# Patient Record
Sex: Male | Born: 1955 | Race: White | Hispanic: No | Marital: Married | Smoking: Never smoker
Health system: Southern US, Community
[De-identification: ages and names within clinical notes are randomized; demographics above are authoritative.]

## PROBLEM LIST (undated history)

## (undated) DIAGNOSIS — I4891 Unspecified atrial fibrillation: Secondary | ICD-10-CM

## (undated) HISTORY — DX: Unspecified atrial fibrillation: I48.91

## (undated) HISTORY — PX: TENDON EXPLORATION: SHX5112

## (undated) HISTORY — PX: BICEPS TENDON REPAIR: SHX566

---

## 2006-04-30 ENCOUNTER — Encounter: Payer: Self-pay | Admitting: Infectious Diseases

## 2006-05-10 ENCOUNTER — Encounter: Payer: Self-pay | Admitting: Infectious Diseases

## 2006-05-10 ENCOUNTER — Ambulatory Visit: Payer: Self-pay | Admitting: Infectious Diseases

## 2006-05-10 DIAGNOSIS — M65849 Other synovitis and tenosynovitis, unspecified hand: Secondary | ICD-10-CM

## 2006-05-10 DIAGNOSIS — M65839 Other synovitis and tenosynovitis, unspecified forearm: Secondary | ICD-10-CM

## 2006-05-11 ENCOUNTER — Telehealth: Payer: Self-pay | Admitting: Infectious Diseases

## 2006-05-11 ENCOUNTER — Encounter: Payer: Self-pay | Admitting: Infectious Diseases

## 2006-05-11 LAB — CONVERTED CEMR LAB
ALT: 22 units/L (ref 0–53)
AST: 21 units/L (ref 0–37)
Basophils Absolute: 0 10*3/uL (ref 0.0–0.1)
CO2: 25 meq/L (ref 19–32)
CRP: 1.5 mg/dL — ABNORMAL HIGH (ref <0.6)
Chloride: 103 meq/L (ref 96–112)
Eosinophils Absolute: 0.3 10*3/uL (ref 0.0–0.7)
Lymphocytes Relative: 23 % (ref 12–46)
Lymphs Abs: 1.3 10*3/uL (ref 0.7–3.3)
Neutrophils Relative %: 61 % (ref 43–77)
Platelets: 285 10*3/uL (ref 150–400)
RDW: 12.4 % (ref 11.5–14.0)
Sed Rate: 26 mm/hr — ABNORMAL HIGH (ref 0–16)
Sodium: 142 meq/L (ref 135–145)
Total Bilirubin: 0.9 mg/dL (ref 0.3–1.2)
Total Protein: 7 g/dL (ref 6.0–8.3)
WBC: 5.7 10*3/uL (ref 4.0–10.5)

## 2006-05-12 ENCOUNTER — Encounter: Payer: Self-pay | Admitting: Infectious Diseases

## 2006-05-26 ENCOUNTER — Telehealth: Payer: Self-pay | Admitting: Infectious Diseases

## 2006-06-01 ENCOUNTER — Inpatient Hospital Stay (HOSPITAL_COMMUNITY): Admission: RE | Admit: 2006-06-01 | Discharge: 2006-06-05 | Payer: Self-pay | Admitting: Orthopedic Surgery

## 2006-06-02 ENCOUNTER — Ambulatory Visit: Payer: Self-pay | Admitting: Infectious Diseases

## 2006-06-09 ENCOUNTER — Encounter: Payer: Self-pay | Admitting: Infectious Diseases

## 2006-06-16 ENCOUNTER — Encounter: Payer: Self-pay | Admitting: Infectious Diseases

## 2006-06-23 ENCOUNTER — Encounter: Payer: Self-pay | Admitting: Infectious Diseases

## 2006-06-25 ENCOUNTER — Ambulatory Visit: Payer: Self-pay | Admitting: Infectious Diseases

## 2006-07-07 ENCOUNTER — Encounter: Payer: Self-pay | Admitting: Infectious Diseases

## 2006-07-08 ENCOUNTER — Telehealth: Payer: Self-pay | Admitting: Infectious Diseases

## 2006-07-12 ENCOUNTER — Encounter: Payer: Self-pay | Admitting: Infectious Diseases

## 2006-07-15 ENCOUNTER — Encounter: Payer: Self-pay | Admitting: Infectious Diseases

## 2006-07-20 ENCOUNTER — Telehealth: Payer: Self-pay | Admitting: Infectious Diseases

## 2006-07-25 ENCOUNTER — Encounter: Payer: Self-pay | Admitting: Infectious Diseases

## 2006-08-02 ENCOUNTER — Ambulatory Visit: Payer: Self-pay | Admitting: Infectious Diseases

## 2006-08-06 ENCOUNTER — Encounter: Payer: Self-pay | Admitting: Infectious Diseases

## 2006-08-20 ENCOUNTER — Encounter: Payer: Self-pay | Admitting: Infectious Diseases

## 2006-09-05 ENCOUNTER — Encounter: Payer: Self-pay | Admitting: Infectious Diseases

## 2006-10-13 ENCOUNTER — Encounter: Payer: Self-pay | Admitting: Infectious Diseases

## 2006-10-20 ENCOUNTER — Ambulatory Visit: Payer: Self-pay | Admitting: Infectious Diseases

## 2007-01-19 ENCOUNTER — Ambulatory Visit: Payer: Self-pay | Admitting: Infectious Diseases

## 2007-05-04 ENCOUNTER — Ambulatory Visit: Payer: Self-pay | Admitting: Infectious Diseases

## 2009-01-23 ENCOUNTER — Encounter: Payer: Self-pay | Admitting: Infectious Diseases

## 2009-01-30 ENCOUNTER — Encounter: Payer: Self-pay | Admitting: Infectious Diseases

## 2009-02-20 ENCOUNTER — Encounter: Payer: Self-pay | Admitting: Infectious Diseases

## 2009-03-22 ENCOUNTER — Encounter: Payer: Self-pay | Admitting: Infectious Diseases

## 2010-04-15 NOTE — Consult Note (Signed)
Summary: [facility]  [facility]   Imported By: Florinda Marker 03/27/2009 15:38:33  _____________________________________________________________________  External Attachment:    Type:   Image     Comment:   External Document

## 2010-04-15 NOTE — Consult Note (Signed)
Summary: [facility]  [facility]   Imported By: Florinda Marker 04/03/2009 15:20:53  _____________________________________________________________________  External Attachment:    Type:   Image     Comment:   External Document

## 2010-08-01 NOTE — Op Note (Signed)
NAMEDEIONDRE, HARROWER              ACCOUNT NO.:  [id number]   MEDICAL RECORD NO.:  [id number]          PATIENT TYPE:  INP   LOCATION:  5003                         FACILITY:  [facility]   PHYSICIAN:  Dionne Ano. Gramig III, M.D.DATE OF BIRTH:  1956-01-31   DATE OF PROCEDURE:  06/01/2006  DATE OF DISCHARGE:                               OPERATIVE REPORT   PREOPERATIVE DIAGNOSIS:  Atypical Mycobacterial infection, right palm,  hand, middle finger, and wrist forearm region.  This patient is status  post irrigation and debridement.  He presents for repeat irrigation and  debridement.   POSTOPERATIVE DIAGNOSIS:  Atypical Mycobacterial infection, right palm,  hand, middle finger, and wrist forearm region.  This patient is status  post irrigation and debridement.  He presents for repeat irrigation and  debridement.   PROCEDURE:  1. Irrigation and debridement right wrist skin, subcutaneous tissue,      tendon, muscle, this was an excisional debridement.  2. Flexor tenosynovectomy extensive in nature, right wrist and      forearm.  3. Irrigation and debridement right middle finger skin, subcutaneous      tissue, periosteal tissue and tendon tissue.  This was an      excisional debridement.  4. Tenosynovectomy flexor digitorum profundus and flexor digitorum      superficialis, right middle finger.   SURGEON:  Dionne Ano. Amanda Pea, M.D.   ASSISTANT:  Madelynn Done, M.D.   COMPLICATIONS:  None.   ANESTHESIA:  General.   INDICATIONS FOR PROCEDURE:  This patient is a 55 year old male who  presents with the above mentioned diagnosis.  I have counseled him with  regards to the risks and benefits of surgery including risks of  infection, bleeding, anesthesia, damage to normal structures, and  failure of surgery to accomplish its intended goals of relieving  symptoms and restoring function.  With this in mind, he has consented to  proceed.  All questions have been encouraged and answered  preoperatively.   OPERATIVE PROCEDURE:  The patient was seen by myself and anesthesia,  taken to the operating suite, underwent smooth induction of general  anesthesia. He was placed supine, appropriately padded, prepped and  draped in the usual sterile fashion.  The arm was marked prior to  surgery.  Consent was signed and discussion was performed.  Once in the  operative suite, I performed I&D of skin, subcutaneous tissue,  periosteal tissue, tendon and the pulley system about the middle finger.  This was an excisional debridement and was carried out meticulously  without difficulty.  Following the excisional debridement of this  region, I then performed a tenosynovectomy and tenolysis of the FDP and  FDS tendons.  I did not see a tremendous buildup of infectious disease.   Following this, I then performed I&D of skin, subcutaneous tissue,  muscle, tendon, and periosteal tissue about the wrist, forearm and hand  region.  This was an excisional debridement.  I then performed a  tenosynovectomy and tenolysis of the FDP and FDS tendon apparatus.  The  patient tolerated this well.  Following this, I then inspected the  median nerve and superficial palmar arch as I did at multiple points  throughout the operation and noted the patient was in excellent working  status in terms of these the neurovascular structures.  Following this,  we then performed an aggressive irrigation of greater than 6 liters of  fluid through the hand, wrist, forearm and finger region.   He tolerated this well.  Pediatric feeding catheter drains were placed  x2 for a continuous inflow system of lidocaine without epinephrine and  saline mixture.  I then closed the wounds very loosely over vessel loop  drains, dressed it nicely with the dressing and he was awakened and  taken to the recovery room.  He will be continued on rifampin,  ethambutol, and Biaxin for antibiotic regimen and notify me should any  problems  occur and we will plan to see him back in the operative suite  in 48 hours for repeat I&D and possible loose closure.  We will continue  a very aggressive approach to his upper extremity predicament.  He  tolerated the procedure well today.           ______________________________  Dionne Ano. Everlene Other, M.D.     Nash Mantis  D:  06/03/2006  T:  06/03/2006  Job:  [id number]

## 2010-08-01 NOTE — Discharge Summary (Signed)
NAMEBOYD, [name]              ACCOUNT NO.:  [id number]   MEDICAL RECORD NO.:  [id number]          PATIENT TYPE:  INP   LOCATION:  5003                         FACILITY:  [facility]   PHYSICIAN:  Dionne Ano. Gramig, M.D.DATE OF BIRTH:  07-20-1955   DATE OF ADMISSION:  06/01/2006  DATE OF DISCHARGE:  06/05/2006                               DISCHARGE SUMMARY   ADMISSION DIAGNOSIS:  Right middle finger atypical infection with  mycobacterial species involving the middle finger, wrist and palm.   DISCHARGE DIAGNOSES:  Right middle finger atypical infection with  mycobacterial species involving the middle finger, wrist and palm.   SURGEON:  Dominica Severin, M.D.   PROCEDURES:  1. Multiple I&Ds of right middle finger skin and subcutaneous tissue      __________  tendon with noted extensive epi-S, epi-P, tenolysis,      tenosynovectomy, revision carpal tunnel.  2. __________ .  3. Tenosynovectomy about the flexor apparatus of the wrist and      forearm.  4. __________  pulley release from around the finger.   CONSULTATIONS:  Infectious disease.   BRIEF HISTORY OF PRESENT ILLNESS:  [name] is a very pleasant 55-year-  old gentleman who is well known to the practice and has been followed  diligently for the above-mentioned diagnosis.  He had previously been  seen and evaluated and underwent initial tenosynovectomy which was  highly suspicious for atypical mycobacterial species.  Given his  presentation, infectious disease was consulted.  The patient was placed  on Biaxin and ethambutol.  He had initial improvement, but, during  followup postoperatively in the clinical setting, he was noted to have  an incomplete resolution of his infectious process, and the decision was  made to return to the operative suite for repeat I&D as needed.  Cultures were pending, again with a high suspicion of an atypical  mycobacterium.  Preoperative laboratory data was stable.   HOSPITAL COURSE:  Mr.  Andrade was admitted on June 01, 2006, for  significant mycobacterial infection about the right hand.  He underwent  a series of three trips to the operative suite for repeat I&D, extensive  tenosynovectomy and repair reconstruction of associated structures as  noted above.  Infectious disease was consulted throughout his hospital  course, and he was placed on Rifampin, Biaxin and ethambutol for  atypical coverage.  Doses were as follows:  Ethambutol 400 mg p.o.  q.i.d., Rifampin 300 mg 1 p.o. b.i.d. and Biaxin 500 mg p.o. daily.  The  patient was placed on standard postoperative orders of pain management,  elevation, wound care and therapy.  He did well throughout his  postoperative course which was relatively uneventful.  T-max was 100.1.  Otherwise, the patient remained stable.  Multiple cultures were obtained  intraoperatively as he did undergo three separate I&Ds while inpatient.  Infectious disease followed closely.  His wounds continued to improve.  As he was stable, the decision was made to discharge him home.  Final  cultures were subsequently obtained which did show mycobacterial  infection consistent with Mycobacterium terrae.  On 06/05/2006, given  his overall improvement,  the decision was made to discharge him home.  He was afebrile.  Vital signs were stable.  His wounds were improved  overall.   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   CONDITION ON DISCHARGE:  Guarded in terms of the upper extremity.   DISCHARGE ACTIVITIES:  He will keep his dressings clean, dry and intact.  He will elevate frequently.   DISCHARGE MEDICATIONS:  1. Rifampin 300 mg 1 p.o. b.i.d.  2. Ethambutol 400 mg 4 tablets daily x6 months.  3. Biaxin 500 mg 1 p.o. b.i.d.  4. Percocet 5/325 1-2 p.o. q.4-6 h. p.r.n. pain.  5. Vitamin C 500 mg p.o. b.i.d.    FOLLOWUP:  He will return for followup to see Dr. Amanda Pea on Wednesday at  8:00 a.m.  He will need significant therapeutic endeavors.  He  understands the significant state of his upper extremity as discussed  with him at multiple times.  We will certainly approach this very  guardedly and cautiously given the significant infection with which he  presents.      Karie Chimera, P.A.-C.    ______________________________  Dionne Ano. Amanda Pea, M.D.    BB/MEDQ  D:  08/05/2006  T:  08/05/2006  Job:  [id number]

## 2010-08-01 NOTE — Op Note (Signed)
[name], [name]              ACCOUNT NO.:  [id number]   MEDICAL RECORD NO.:  [id number]          PATIENT TYPE:  INP   LOCATION:  5003                         FACILITY:  [facility]   PHYSICIAN:  Dionne Ano. Gramig III, M.D.DATE OF BIRTH:  1956/01/21   DATE OF PROCEDURE:  06/01/2006  DATE OF DISCHARGE:                               OPERATIVE REPORT   PREOPERATIVE DIAGNOSIS:  Right middle finger atypical infection, with a  presented Mycobacterial species involving the middle finger, wrist, and  palm.   POSTOPERATIVE DIAGNOSIS:  Right middle finger atypical infection, with a  presented mycobacterial species involving the middle finger, wrist, and  palm.   PROCEDURES:  1. I&D (irrigation and debridement) right middle finger skin,      subcutaneous tissue, muscle, periosteum, and tendon.  2. Extensive flexor digitorum profundus and flexor digitorum      superficialis tenolysis and tenosynovectomy, with debridement of      the tendons, right middle finger.  3. Revision carpal tunnel release.  4. Median nerve neurolysis, right wrist.  5. Extensive tenosynovectomy about the entire flexor apparatus of the      wrist and forearm, with noted atypical mycobacterial type      infectious process noted.  6. A4 and A2 pulley release, right middle finger   SURGEON:  Dionne Ano. Amanda Pea, M.D.   ASSISTANT:  Karie Chimera, P.A.-C.   COMPLICATIONS:  None.   ANESTHESIA:  General.   TOURNIQUET TIME:  Less than 90 minutes.   DRAINS:  Two pediatric feeding catheter drains were placed for  continuous irrigation.   CULTURES:  Tissue and fluid cultures were sent.   INDICATIONS FOR THE PROCEDURE:  [name] is a 55 year old male who  presents with the above-mentioned diagnosis.  I had previously seen the  patient and performed initial tenosynovectomy which was highly  suspicious for atypical mycobacterial species. After his  tenosynovectomy, he immediately was started on Biaxin and ethambutol  and  was seen by infectious disease, Dr. Johny Sax.  Since that time,  the patient improved initially, only to have a somewhat worsening  clinical picture in terms of incomplete resolution of the infectious  process.  I have discussed with [place] my concerns and recommended a  repeat look at the flexor apparatus, as I feel that his process has  reaccumulated and is not making clinical progression as I would like for  it to.  I have discussed with him the risks and benefits of bleeding,  infection, anesthesia, damage to normal structures, and failure of  surgery to accomplish its intended goals of relieving symptoms and  restoring function.  I also specific fully discussed with the patient  the possible risk of need for amputation and the very great difficulty  at times of eradicating an atypical mycobacterial process.  He has had  symptoms ongoing since August 2007.  I have gone over all pre- and  postop issues with him.  I have discussed with him that in my opinion a  repeat second look is absolutely necessary at this juncture, given his  failure to progress.   OPERATION IN  DETAIL:  The patient was seen by myself and anesthesia.  He  was taken to the procedural suite.  Preoperatively, the patient was  consented.  The arm was marked, and he underwent thorough counseling.  In the operative suite, the patient was prepped and draped after general  anesthesia was performed.  Prep and drape with Betadine scrub and  solution was accomplished.  Following this, the tourniquet was  insufflated to 250 mmHg, and the patient underwent incision about the  PIP region along previously made incisions.  Dissection was carried  down.  I immediately encountered what appeared to be reaccumulation of  his infectious process.  At the time of his first I&D, the patient had  markedly abnormal findings in his tendon architecture, and this  continued to show itself.  The FDP (flexor digitorum profundus)  was  markedly abnormal and torn.  The FDS was markedly attenuated as well.   At this time, I performed a brief I&D of skin, subcutaneous tissue,  periosteal tissue, and tendon and muscle tissue.  This was done in the  finger region.  Given the reaccumulation of infectious process, I  cultured him with fluid and tissue cultures.   At this time, I extended his incision and then performed a very careful  and meticulous tenosynovectomy of the FDP and FDS tendons to the middle  finger.   Following this, it was quite clear that the profundus and superficialis  tendons were highly abnormal.  The superficialis was elongated,  attenuated, and poorly competent.  The FDP tendon was the most part  ruptured.  I debrided these tendons as necessary.  Following this, I  noted a decent stump of the FDP proximally and felt that possibly an FDP-  to-FDS tendon transfer would be the next best step for this patient  along with an FDP tenodesis if his wound conditions improved ultimately.   I then dissected more proximally, tracking the infectious process.  This  went into his carpal tunnel, and thus I crossed the superficial palmar  arch region carefully, identifying the arch, and then performed an  extended carpal tunnel release incision into the forearm while tracking  the infectious process.   I then performed an extensive and exuberant tenosynovectomy of the wrist  and forearm flexor digitorum profundus. flexor digitorum superficialis,  and flexor pollicis longus.  Copious amounts of abnormal tissue and  fluid were retrieved and removed.   Following this, I then performed a median nerve neurolysis and of course  revision carpal tunnel release.  I identified the transverse carpal  ligament and re-released this as well as the proximal leaflet and the  antebrachial fascia.  This was a release performed without difficulty  under direct vision.  I then identified the median nerve and performed  a distinct and separate neurolysis of the nerve to keep it free from  harm's way.  The tenosynovectomy was complete, without difficulty.   Following this, I then deflated the tourniquet and noted excellent  refill.  The superficial palmar arch was protected, identified, and  dissected out during the course of the operative procedure.   Thus, extensive dissection from the tip of the middle finger to the  distal forearm was accomplished, and an exuberant extensive  tenosynovectomy of the finger, palm, hand, wrist, and forearm was  performed. Tenolysis and tenosynovectomy were accomplished, with noted  rupture of the FDP and a very poorly functioning FDS.  I would consider  FDP-to-FDS tendon transfer in the future if  the patient improves his  infectious process.  I would give him a very guarded prognosis.   I should also note median nerve neurolysis, carpal tunnel release, and  A2 as well as A4 pulley release were performed.   Following this, I placed a pediatric feeding catheter in the wound and  irrigated the patient with 6 liters of saline.  I took great care once  again to meticulously remove the highly impregnated infectious process  that surrounds the tissue.  This is going to be a very difficult  infection to eradicate, and I have discussed this with him and his wife.   The pediatric catheter was hooked up to continuous drainage  postoperatively, I should note.   He was tacked closed with Prolene but left open in spots.  However, all  tendon areas were covered.  I placed vessel loop drains, the pediatric  feeding catheter in the wound, and will plan for copious drip lavage and  return to the operative suite in 48 hours.  We have gone over these  issues at length, and all questions have been encouraged and answered.   I have spent over 30-45 minutes discussing his findings and issues with  him and his family.  We are going to continue a very aggressive course  of care in hopes  to try and eradicate his infection.  However, I would  give him a very guarded prognosis at this juncture.  The family and  patient understand this.  All questions have been encouraged and  answered.           ______________________________  Dionne Ano. Everlene Other, M.D.     Nash Mantis  D:  06/01/2006  T:  06/02/2006  Job:  [id number]

## 2010-08-01 NOTE — Op Note (Signed)
[name], [name]              ACCOUNT NO.:  [id number]   MEDICAL RECORD NO.:  [id number]          PATIENT TYPE:  INP   LOCATION:  5003                         FACILITY:  [facility]   PHYSICIAN:  Dionne Ano. Gramig III, M.D.DATE OF BIRTH:  04-27-1955   DATE OF PROCEDURE:  06/05/2006  DATE OF DISCHARGE:                               OPERATIVE REPORT   PREOPERATIVE DIAGNOSIS:  Atypical Mycobacterial infection, right hand  including middle finger flexor tendons, wrist and distal forearm.  He is  status post multiple I&D's and initiation of antibiotic management and  presents for third washout this hospitalization.   POSTOPERATIVE DIAGNOSIS:  Atypical Mycobacterial infection, right hand  including middle finger flexor tendons, wrist and distal forearm.  He is  status post multiple I&D's and initiation of antibiotic management and  presents for third washout this hospitalization.   PROCEDURE:  1. I&D.  Irrigation and debridement right middle finger.  This was an      excisional debridement of skin, subcutaneous tissue, muscle and      tendon with extensive removal of nonviable pre necrotic tissue.  2. Extensive tenosynovectomy and tenolysis right middle finger FDP and      FDS tendons (flexor digitorum superficialis and flexor digitorum      profundus tendons).  3. I&D wrist and forearm skin, subcutaneous tissue, muscle and      periosteal tissue.  This was excisional debridement.  4. Extensive tenosynovectomy and tenolysis flexor tendon apparatus      right wrist and forearm.  5. Median nerve neurolysis, right wrist and hand region.  6. Flexor digitorum profundus tenodesis to the superficialis and      periosteal tissue right middle finger.  7. Flex digitorum profundus tendon transfer to the flexor digitorum      superficialis in the form of a tendon transfer secondary to the FDS      attenuation and FDP disruption.   SURGEON:  Dr. Dominica Severin   ASSISTANT:  None.   COMPLICATIONS:  None.   ANESTHESIA:  General.   TOURNIQUET TIME:  Less than an hour.   DRAINS:  Two.   INDICATIONS FOR PROCEDURE:  [name] is a 55 year old male presents  with an atypical aggressive mycobacterial infection.  I have counseled  him in regards to risks and benefits of surgery.  This is the third I&D  this hospitalization. He has made ample improvements.  He understands  the proposed treatment algorithm and operative intervention noted above.  All questions have been encouraged and answered.   OPERATIVE PROCEDURE:  The patient induced with anesthesia, arm was  marked.  He was thoroughly consented and underwent preop counseling. He  was taken to the procedural suite/surgical suite and underwent general  anesthetic under the direction of Dr. Laverle Hobby.  Following this the  patient was prepped, draped in sterile fashion Betadine scrub and paint.  I removed his pediatric feeding catheter and noted that the condition of  the hand was excellent.  I then opened up the hand under sterile field  and inflated the tourniquet to 250 mmHg.   The patient underwent  I&D of the right middle finger including skin,  subcutaneous tissue, muscle and tendon.  This was done meticulously from  the tip of the middle finger to the superficial palmar arch region.  Following this, I then turned attention towards the wrist and forearm  and through the extended incision performed an I&D of skin, subcutaneous  tissue, muscle and periosteal tissue.  I saw no infectious remnants.  I  performed the I&D meticulously.   Following this, I performed meticulous tenosynovectomy of the FDP and  FDS tendons, right middle finger, freeing adhesions and removing any  nonviable tissue utilizing tenosynovectomy.   Following this I performed a right wrist median nerve neurolysis. I very  carefully mobilized nerve and swept this out of harm's way.  This  functioning nicely preoperatively.   Following  this, I then performed a tenolysis and tenosynovectomy of the  FDP and FDS tendons.  Each one was gathered and all film, debris and  particulate matter was removed from them.  I had a very clean field  which looked excellent.  I took an intraoperative photo of this.  Following this, I then performed irrigation with 6 liters of saline in  the wrist, forearm, hand, and finger.  The patient tolerated this well.   Once the irrigant was completely infused, I then performed tenodesis at  the FDP tendon.   The FDP tendon had attenuated tear and thus distally I performed  tenodesis of the FDP to the periosteal tissue and portions of the FDS  insertion.  This placed the DIP joint in slight flexion.   Following this, I then performed the flexor digitorum profundus tendon  transfer to flexor digitorum superficialis with Pulver-Taft weave  technique.  This was inset nicely without difficulty.  This was done to  provide a tendon and a finger that will be FDS driven.  Proper tension  was set and this was inset with 4-0 FiberWire.  I did not repair the  pulley system as I felt this would create undue strain and would rather  deal with slight bowstring in the future.  It is my impression that this  patient may likely have significant adhesions which may require further  intervention in the future and thus if pulley reconstruction is needed,  it could be considered at that time once the infection is completely  eradicated.   Following the tendon transfer, I then performed deflation of the  tourniquet, placement of two TLS drains, and following this closure of  the wound with Prolene. He was placed in a sterile dressing and a dorsal  splint was placed without difficulty.  There were no complicating  features.  He had excellent refill, soft compartments and hopefully will  continue to improve.   He was taken to recovery room after extubation.  He will be monitored closely and continued on rifampin,  ethambutol and Biaxin in terms of  antibiotic regime until final cultures are obtained.  He is growing an  acid-fast bacilli and has a presumed atypical mycobacterial infection of  course.  We have discussed these issues with he and  his family.  They are well aware of the plans.  Dr. Ninetta Lights and myself  (Dr. Ninetta Lights is infectious disease doctor) will continue close  observation and aggressive approach at eradicating this very difficult  infection.  All questions have been encouraged and answered.           ______________________________  Dionne Ano. Everlene Other, M.D.     [facility]  D:  06/05/2006  T:  06/05/2006  Job:  [id number]

## 2011-06-04 ENCOUNTER — Inpatient Hospital Stay: Payer: Self-pay | Admitting: Internal Medicine

## 2011-06-04 ENCOUNTER — Encounter: Payer: Self-pay | Admitting: Cardiovascular Disease

## 2011-06-04 LAB — CK TOTAL AND CKMB (NOT AT ARMC)
CK, Total: 203 U/L (ref 35–232)
CK-MB: 3.9 ng/mL — ABNORMAL HIGH (ref 0.5–3.6)

## 2011-06-04 LAB — COMPREHENSIVE METABOLIC PANEL
Alkaline Phosphatase: 66 U/L (ref 50–136)
BUN: 22 mg/dL — ABNORMAL HIGH (ref 7–18)
Calcium, Total: 8.6 mg/dL (ref 8.5–10.1)
Chloride: 107 mmol/L (ref 98–107)
Co2: 25 mmol/L (ref 21–32)
Creatinine: 1.34 mg/dL — ABNORMAL HIGH (ref 0.60–1.30)
EGFR (African American): >60
EGFR (Non-African Amer.): 58 — ABNORMAL LOW
Osmolality: 286 (ref 275–301)
SGPT (ALT): 30 U/L

## 2011-06-04 LAB — PRO B NATRIURETIC PEPTIDE: B-Type Natriuretic Peptide: 3288 pg/mL — ABNORMAL HIGH (ref 0–125)

## 2011-06-04 LAB — CBC
HCT: 40.9 % (ref 40.0–52.0)
MCH: 30.6 pg (ref 26.0–34.0)
Platelet: 234 10*3/uL (ref 150–440)
RBC: 4.52 10*6/uL (ref 4.40–5.90)
WBC: 6.4 10*3/uL (ref 3.8–10.6)

## 2011-06-04 LAB — MAGNESIUM: Magnesium: 2 mg/dL

## 2011-06-04 LAB — TROPONIN I: Troponin-I: 0.05 ng/mL

## 2011-06-04 LAB — TSH: Thyroid Stimulating Horm: 1.94 u[IU]/mL

## 2011-06-05 DIAGNOSIS — I4891 Unspecified atrial fibrillation: Secondary | ICD-10-CM

## 2011-06-05 DIAGNOSIS — I369 Nonrheumatic tricuspid valve disorder, unspecified: Secondary | ICD-10-CM

## 2011-06-05 LAB — CK TOTAL AND CKMB (NOT AT ARMC)
CK, Total: 122 U/L (ref 35–232)
CK-MB: 2.5 ng/mL (ref 0.5–3.6)
CK-MB: 2.3 ng/mL (ref 0.5–3.6)

## 2011-06-05 LAB — TROPONIN I: Troponin-I: 0.04 ng/mL

## 2011-06-08 ENCOUNTER — Telehealth: Payer: Self-pay | Admitting: Cardiovascular Disease

## 2011-06-08 NOTE — Telephone Encounter (Signed)
Pt was seen in the ED and d/c on the 22nd. Pt states that he is still having irregular HR and is having some nausea he thinks its coming from the meds he was given. Wants to know if there is something he should do

## 2011-06-08 NOTE — Telephone Encounter (Signed)
Pt concerned ABT med's/ nausea/ and app set so far out, request sooner app than 06/26/11. Sooner app made for this thursday 3/28, Was seen in ED/[place] over weekend and met Dr Mariah Milling, new med's started//cardizem, pradaxa and an unknown med, pt at work and unsure of a med for "infection".   Please advise if you feel anything else can be done by phone. Pt was happy with sooner app.

## 2011-06-09 NOTE — Telephone Encounter (Signed)
I left a message for the patient to call. 

## 2011-06-09 NOTE — Telephone Encounter (Signed)
Needs blood pressure, heart rates He is new atrial fib Started on diltiazem 30 Q8 with digoxin 0.25, pradaxa BID

## 2011-06-10 ENCOUNTER — Encounter: Payer: Self-pay | Admitting: *Deleted

## 2011-06-10 LAB — CULTURE, BLOOD (SINGLE)

## 2011-06-11 ENCOUNTER — Encounter: Payer: Self-pay | Admitting: Cardiovascular Disease

## 2011-06-11 ENCOUNTER — Ambulatory Visit (INDEPENDENT_AMBULATORY_CARE_PROVIDER_SITE_OTHER): Payer: BC Managed Care – PPO | Admitting: Cardiovascular Disease

## 2011-06-11 VITALS — BP 110/70 | HR 92 | Ht 70.0 in | Wt 184.0 lb

## 2011-06-11 DIAGNOSIS — I519 Heart disease, unspecified: Secondary | ICD-10-CM | POA: Insufficient documentation

## 2011-06-11 DIAGNOSIS — I4891 Unspecified atrial fibrillation: Secondary | ICD-10-CM

## 2011-06-11 DIAGNOSIS — I428 Other cardiomyopathies: Secondary | ICD-10-CM

## 2011-06-11 NOTE — Patient Instructions (Signed)
You are doing well. No medication changes were made.  Please call us if you have new issues that need to be addressed before your next appt.  Your physician wants you to follow-up in: 1 months.  You will receive a reminder letter in the mail two months in advance. If you don't receive a letter, please call our office to schedule the follow-up appointment.

## 2011-06-11 NOTE — Assessment & Plan Note (Signed)
Ejection fraction is depressed at 35-45%. My suspicion is that this is from atrial fibrillation. We should repeat echocardiogram after he has normal sinus rhythm restored. He may benefit from stress test if T wave abnormality continues in anterolateral and inferior leads.

## 2011-06-11 NOTE — Assessment & Plan Note (Signed)
Continues in nature fibrillation. Rate is reasonably well controlled at rest. No changes made to his medications. We will discuss starting amiodarone with him in one month, possible cardioversion.

## 2011-06-11 NOTE — Progress Notes (Signed)
   Patient ID: [name], male    DOB: June 01, 1955, 56 y.o.   MRN: [id number]  HPI Comments: [name] is a very pleasant 56 year old gentleman with no cardiac history who presented to [facility] 06/05/2011 after being seen at urgent care for sinusitis and to have atrophic fibrillation with RVR. He was started on Cardizem, digoxin with rate control. Also started on pradaxa and follows up today in clinic.  He was discharged on Levaquin which caused significant GI upset and vomiting. After stopping the antibiotic for his sinusitis, he felt better. He reports no significant shortness of breath, no edema. Overall he feels well. He does have occasional cough.  Echocardiogram shows atrial fibrillation, ejection fraction 35-45% with moderate global hypokinesis, Mildly dilated left atrium, and normal rvsp.  EKG shows atrial fibrillation with ventricular rate 92 beats per minute, T-wave abnormality in lead V4 through V6, 2, 3, aVF   Outpatient Encounter Prescriptions as of 06/11/2011  Medication Sig Dispense Refill  . dabigatran (PRADAXA) 150 MG CAPS Take 150 mg by mouth every 12 (twelve) hours.      . digoxin (LANOXIN) 0.25 MG tablet Take 250 mcg by mouth daily.      Marland Kitchen diltiazem (CARDIZEM) 30 MG tablet Take 30 mg by mouth 3 (three) times daily.      Marland Kitchen levofloxacin (LEVAQUIN) 750 MG tablet Take 750 mg by mouth daily.       Review of Systems  Constitutional: Negative.   HENT: Negative.   Eyes: Negative.   Respiratory: Positive for cough.   Cardiovascular: Negative.   Gastrointestinal: Negative.   Musculoskeletal: Negative.   Skin: Negative.   Neurological: Negative.   Hematological: Negative.   Psychiatric/Behavioral: Negative.   All other systems reviewed and are negative.    BP 110/70  Pulse 92  Ht 5\' 10"  (1.778 m)  Wt 184 lb (83.462 kg)  BMI 26.40 kg/m2  Physical Exam  Nursing note and vitals reviewed. Constitutional: He is oriented to person, place, and time. He appears  well-developed and well-nourished.  HENT:  Head: Normocephalic.  Nose: Nose normal.  Mouth/Throat: Oropharynx is clear and moist.  Eyes: Conjunctivae are normal. Pupils are equal, round, and reactive to light.  Neck: Normal range of motion. Neck supple. No JVD present.  Cardiovascular: Normal rate, regular rhythm, S1 normal, S2 normal, normal heart sounds and intact distal pulses.  Exam reveals no gallop and no friction rub.   No murmur heard. Pulmonary/Chest: Effort normal and breath sounds normal. No respiratory distress. He has no wheezes. He has no rales. He exhibits no tenderness.  Abdominal: Soft. Bowel sounds are normal. He exhibits no distension. There is no tenderness.  Musculoskeletal: Normal range of motion. He exhibits no edema and no tenderness.  Lymphadenopathy:    He has no cervical adenopathy.  Neurological: He is alert and oriented to person, place, and time. Coordination normal.  Skin: Skin is warm and dry. No rash noted. No erythema.  Psychiatric: He has a normal mood and affect. His behavior is normal. Judgment and thought content normal.           Assessment and Plan

## 2011-06-26 ENCOUNTER — Encounter: Payer: Self-pay | Admitting: Cardiovascular Disease

## 2011-07-01 ENCOUNTER — Encounter: Payer: Self-pay | Admitting: *Deleted

## 2011-07-14 ENCOUNTER — Encounter: Payer: Self-pay | Admitting: Cardiovascular Disease

## 2011-07-15 ENCOUNTER — Ambulatory Visit: Payer: BC Managed Care – PPO | Admitting: Cardiovascular Disease

## 2011-07-29 ENCOUNTER — Encounter: Payer: Self-pay | Admitting: Cardiovascular Disease

## 2011-07-29 ENCOUNTER — Ambulatory Visit (INDEPENDENT_AMBULATORY_CARE_PROVIDER_SITE_OTHER): Payer: BC Managed Care – PPO | Admitting: Cardiovascular Disease

## 2011-07-29 VITALS — BP 105/74 | HR 90 | Ht 70.0 in | Wt 188.0 lb

## 2011-07-29 DIAGNOSIS — I428 Other cardiomyopathies: Secondary | ICD-10-CM

## 2011-07-29 DIAGNOSIS — I4891 Unspecified atrial fibrillation: Secondary | ICD-10-CM

## 2011-07-29 MED ORDER — AMIODARONE HCL 200 MG PO TABS: 200.0000 mg | ORAL_TABLET | Freq: Two times a day (BID) | ORAL | Status: DC

## 2011-07-29 NOTE — Assessment & Plan Note (Signed)
No symptoms while in atrial fibrillation with adequate rate control. Anticoagulation now for 6 weeks. We will start amiodarone 200 mg twice a day with cardioversion in 2 weeks. EKG prior to cardioversion with lab work at that time.

## 2011-07-29 NOTE — Patient Instructions (Addendum)
You are doing well. Please start amiodarone one in the AM and PM  Please send [place] an EKG in two weeks  If you are in atrial fibrillation at that time, we will schedule a cardioversion  Please call us if you have new issues that need to be addressed before your next appt.  Your physician wants you to follow-up in: 2 months.  You will receive a reminder letter in the mail two months in advance. If you don't receive a letter, please call our office to schedule the follow-up appointment.

## 2011-07-29 NOTE — Assessment & Plan Note (Signed)
Cardiomyopathy on echocardiogram. I would suggest repeating echocardiogram after restoring normal sinus rhythm to determine if ejection fraction will return to normal. If ejection fraction stays reduced, would pursue ischemia workup.

## 2011-07-29 NOTE — Progress Notes (Signed)
   Patient ID: [name], male    DOB: 1955-04-22, 56 y.o.   MRN: [id number]  HPI Comments: [name] is a very pleasant 56 year old gentleman with no cardiac history who presented to [facility] 06/05/2011 after being seen at urgent care for sinusitis found to have atrial fibrillation with RVR. He was started on Cardizem, digoxin, pradaxa. He presents for routine followup .  He was discharged on Levaquin which caused significant GI upset and vomiting. After stopping the antibiotic for his sinusitis, he felt better. He reports no significant shortness of breath, no edema.  Overall he feels well. He has been on anticoagulation for 6 weeks with no problems. He is interested in cardioversion  Echocardiogram shows atrial fibrillation, ejection fraction 35-45% with moderate global hypokinesis, Mildly dilated left atrium, and normal rvsp.  EKG shows atrial fibrillation with ventricular rate 90 beats per minute, T-wave abnormality in lead V4 through V6, 2, 3, aVF   Outpatient Encounter Prescriptions as of 07/29/2011  Medication Sig Dispense Refill  . dabigatran (PRADAXA) 150 MG CAPS Take 150 mg by mouth every 12 (twelve) hours.      . digoxin (LANOXIN) 0.25 MG tablet Take 250 mcg by mouth daily.      Marland Kitchen diltiazem (CARDIZEM) 30 MG tablet Take 30 mg by mouth 3 (three) times daily.       Review of Systems  Constitutional: Negative.   HENT: Negative.   Eyes: Negative.   Cardiovascular: Negative.   Gastrointestinal: Negative.   Musculoskeletal: Negative.   Skin: Negative.   Neurological: Negative.   Hematological: Negative.   Psychiatric/Behavioral: Negative.   All other systems reviewed and are negative.    BP 105/74  Pulse 90  Ht 5\' 10"  (1.778 m)  Wt 188 lb (85.276 kg)  BMI 26.98 kg/m2  Physical Exam  Nursing note and vitals reviewed. Constitutional: He is oriented to person, place, and time. He appears well-developed and well-nourished.  HENT:  Head: Normocephalic.  Nose: Nose  normal.  Mouth/Throat: Oropharynx is clear and moist.  Eyes: Conjunctivae are normal. Pupils are equal, round, and reactive to light.  Neck: Normal range of motion. Neck supple. No JVD present.  Cardiovascular: Normal rate, S1 normal, S2 normal, normal heart sounds and intact distal pulses.  An irregularly irregular rhythm present. Exam reveals no gallop and no friction rub.   No murmur heard. Pulmonary/Chest: Effort normal and breath sounds normal. No respiratory distress. He has no wheezes. He has no rales. He exhibits no tenderness.  Abdominal: Soft. Bowel sounds are normal. He exhibits no distension. There is no tenderness.  Musculoskeletal: Normal range of motion. He exhibits no edema and no tenderness.  Lymphadenopathy:    He has no cervical adenopathy.  Neurological: He is alert and oriented to person, place, and time. Coordination normal.  Skin: Skin is warm and dry. No rash noted. No erythema.  Psychiatric: He has a normal mood and affect. His behavior is normal. Judgment and thought content normal.           Assessment and Plan

## 2011-07-31 ENCOUNTER — Other Ambulatory Visit: Payer: Self-pay

## 2011-07-31 MED ORDER — DIGOXIN 250 MCG PO TABS: 250.0000 ug | ORAL_TABLET | Freq: Every day | ORAL | Status: DC

## 2011-07-31 MED ORDER — DILTIAZEM HCL 30 MG PO TABS: 30.0000 mg | ORAL_TABLET | Freq: Three times a day (TID) | ORAL | Status: DC

## 2011-07-31 MED ORDER — DABIGATRAN ETEXILATE MESYLATE 150 MG PO CAPS: 150.0000 mg | ORAL_CAPSULE | Freq: Two times a day (BID) | ORAL | Status: DC

## 2011-07-31 NOTE — Telephone Encounter (Signed)
Refill sent for digoxin. 

## 2011-07-31 NOTE — Telephone Encounter (Signed)
Refill sent for digoxin, pradaxa and diltiazem.

## 2011-08-13 ENCOUNTER — Ambulatory Visit (INDEPENDENT_AMBULATORY_CARE_PROVIDER_SITE_OTHER): Payer: BC Managed Care – PPO

## 2011-08-13 VITALS — BP 120/68 | HR 46 | Ht 70.0 in | Wt 189.0 lb

## 2011-08-13 DIAGNOSIS — I4891 Unspecified atrial fibrillation: Secondary | ICD-10-CM

## 2011-08-13 MED ORDER — DILTIAZEM HCL 30 MG PO TABS: 30.0000 mg | ORAL_TABLET | Freq: Two times a day (BID) | ORAL | Status: DC

## 2011-08-13 NOTE — Progress Notes (Signed)
Pt here for f/u EKG after new meds started.  Consideration for cardioversion if still in atrial fib.  EKG showed sinus bradycardia with a rate of 46 BPM with PACs  Dr. Kirke Corin gave VO to stop digoxin and decrease diltiazem to BID and f/u with Dr. Mariah Milling in 1-2 weeks.

## 2011-08-13 NOTE — Patient Instructions (Signed)
Your physician has recommended you make the following change in your medication: stop digoxin and decrease diltiazem to twice daily instead of three times daily.   Your physician wants you to follow-up in 1-2 weeks with Dr. Mariah Milling. You will receive a reminder letter in the mail two months in advance. If you don't receive a letter, please call our office to schedule the follow-up appointment.

## 2011-08-26 ENCOUNTER — Encounter: Payer: Self-pay | Admitting: Cardiovascular Disease

## 2011-08-26 ENCOUNTER — Ambulatory Visit (INDEPENDENT_AMBULATORY_CARE_PROVIDER_SITE_OTHER): Payer: BC Managed Care – PPO | Admitting: Cardiovascular Disease

## 2011-08-26 VITALS — BP 120/70 | HR 41 | Ht 70.0 in | Wt 189.0 lb

## 2011-08-26 DIAGNOSIS — I519 Heart disease, unspecified: Secondary | ICD-10-CM

## 2011-08-26 DIAGNOSIS — I4891 Unspecified atrial fibrillation: Secondary | ICD-10-CM

## 2011-08-26 MED ORDER — CARVEDILOL 6.25 MG PO TABS: 6.2500 mg | ORAL_TABLET | Freq: Two times a day (BID) | ORAL | Status: DC

## 2011-08-26 NOTE — Patient Instructions (Addendum)
You are doing well. Hold amiodarone and diltiazem Start coreg 6.25 mg twice a day Please do pradaxa for two more weeks  We will order an echocardiogram in 1 month (mid July)  Please call us if you have new issues that need to be addressed before your next appt.  Your physician wants you to follow-up in: 6 week

## 2011-08-26 NOTE — Assessment & Plan Note (Signed)
Maintaining normal sinus rhythm. We'll change diltiazem to Coreg 6.25 mg twice a day, hold amiodarone and hold pradaxa in 2 weeks' time. Repeat echocardiogram in one month.

## 2011-08-26 NOTE — Assessment & Plan Note (Signed)
Previous echocardiogram with low ejection fraction. We will repeat his echocardiogram in one month time. His ejection fraction continues to be low, we could pursue ischemic workup given abnormal EKG. Currently with no symptoms.

## 2011-08-26 NOTE — Progress Notes (Signed)
   Patient ID: [name], male    DOB: 1955/07/31, 56 y.o.   MRN: [id number]  HPI Comments: [name] is a very pleasant 56 year old gentleman with no cardiac history who presented to [facility] 06/05/2011 after being seen at urgent care for sinusitis found to have atrial fibrillation with RVR. He was started on Cardizem, digoxin, pradaxa.   He was discharged on Levaquin which caused significant GI upset and vomiting. After stopping the antibiotic for his sinusitis, he felt better. He reports no significant shortness of breath, no edema. He was started on amiodarone on his last clinic visit in mid May 2013. 2 weeks later, EKG showed sinus rhythm.  Prior Echocardiogram shows atrial fibrillation, ejection fraction 35-45% with moderate global hypokinesis, Mildly dilated left atrium, and normal rvsp.  EKG shows normal sinus rhythm with rate 41 beats per minute with ST and T wave abnormality V3 through V6, 2, 3, aVF    Outpatient Encounter Prescriptions as of 08/26/2011  Medication Sig Dispense Refill  . dabigatran (PRADAXA) 150 MG CAPS Take 150 mg by mouth 2 (two) times daily.      Marland Kitchen amiodarone (PACERONE) 200 MG tablet Take 1 tablet (200 mg total) by mouth 2 (two) times daily.  60 tablet  3  . dabigatran (PRADAXA) 150 MG CAPS Take 1 capsule (150 mg total) by mouth every 12 (twelve) hours.  60 capsule  3  . diltiazem (CARDIZEM) 30 MG tablet Take 1 tablet (30 mg total) by mouth 2 (two) times daily before lunch and supper.  90 tablet  3   Review of Systems  Constitutional: Negative.   HENT: Negative.   Eyes: Negative.   Respiratory: Negative.   Cardiovascular: Negative.   Gastrointestinal: Negative.   Musculoskeletal: Negative.   Skin: Negative.   Neurological: Negative.   Hematological: Negative.   Psychiatric/Behavioral: Negative.   All other systems reviewed and are negative.    BP 120/70  Pulse 41  Ht 5\' 10"  (1.778 m)  Wt 189 lb (85.73 kg)  BMI 27.12 kg/m2  Physical Exam    Nursing note and vitals reviewed. Constitutional: He is oriented to person, place, and time. He appears well-developed and well-nourished.  HENT:  Head: Normocephalic.  Nose: Nose normal.  Mouth/Throat: Oropharynx is clear and moist.  Eyes: Conjunctivae are normal. Pupils are equal, round, and reactive to light.  Neck: Normal range of motion. Neck supple. No JVD present.  Cardiovascular: Normal rate, S1 normal, S2 normal, normal heart sounds and intact distal pulses.  An irregularly irregular rhythm present. Exam reveals no gallop and no friction rub.   No murmur heard. Pulmonary/Chest: Effort normal and breath sounds normal. No respiratory distress. He has no wheezes. He has no rales. He exhibits no tenderness.  Abdominal: Soft. Bowel sounds are normal. He exhibits no distension. There is no tenderness.  Musculoskeletal: Normal range of motion. He exhibits no edema and no tenderness.  Lymphadenopathy:    He has no cervical adenopathy.  Neurological: He is alert and oriented to person, place, and time. Coordination normal.  Skin: Skin is warm and dry. No rash noted. No erythema.  Psychiatric: He has a normal mood and affect. His behavior is normal. Judgment and thought content normal.           Assessment and Plan

## 2011-09-29 ENCOUNTER — Other Ambulatory Visit: Payer: BC Managed Care – PPO

## 2011-10-06 ENCOUNTER — Other Ambulatory Visit (INDEPENDENT_AMBULATORY_CARE_PROVIDER_SITE_OTHER): Payer: BC Managed Care – PPO

## 2011-10-06 ENCOUNTER — Other Ambulatory Visit: Payer: Self-pay

## 2011-10-06 DIAGNOSIS — I4891 Unspecified atrial fibrillation: Secondary | ICD-10-CM

## 2011-10-13 ENCOUNTER — Ambulatory Visit (INDEPENDENT_AMBULATORY_CARE_PROVIDER_SITE_OTHER): Payer: BC Managed Care – PPO | Admitting: Cardiovascular Disease

## 2011-10-13 ENCOUNTER — Encounter: Payer: Self-pay | Admitting: Cardiovascular Disease

## 2011-10-13 VITALS — BP 118/58 | HR 46 | Ht 70.0 in | Wt 192.5 lb

## 2011-10-13 DIAGNOSIS — I4891 Unspecified atrial fibrillation: Secondary | ICD-10-CM

## 2011-10-13 DIAGNOSIS — I519 Heart disease, unspecified: Secondary | ICD-10-CM

## 2011-10-13 DIAGNOSIS — R0602 Shortness of breath: Secondary | ICD-10-CM

## 2011-10-13 MED ORDER — CARVEDILOL 6.25 MG PO TABS: 6.2500 mg | ORAL_TABLET | Freq: Two times a day (BID) | ORAL | Status: DC

## 2011-10-13 NOTE — Patient Instructions (Addendum)
You are doing well. Please continue cardvedilol once a day in the AM  Please call us if you have new issues that need to be addressed before your next appt.  Your physician wants you to follow-up in: 6 months.  You will receive a reminder letter in the mail two months in advance. If you don't receive a letter, please call our office to schedule the follow-up appointment.

## 2011-10-13 NOTE — Assessment & Plan Note (Signed)
He is maintaining normal sinus rhythm. Sinus bradycardia seen. He does have symptoms of dizziness, orthostatic type symptoms. We will decrease the Coreg to 6.25 mg daily. We have suggested he call the office if he converts back to atrial fibrillation. I suspect this initially occurred in the setting of a bad sinusitis infection.

## 2011-10-13 NOTE — Progress Notes (Signed)
   Patient ID: [name], male    DOB: [birth year], 56 y.o.   MRN: [id number]  HPI Comments: [name] is a very pleasant 56 year old gentleman with no cardiac history who presented to [facility] 06/05/2011 after being seen at urgent care for sinusitis found to have atrial fibrillation with RVR. He was started on Cardizem, digoxin, pradaxa.   He was discharged on Levaquin which caused significant GI upset and vomiting. After stopping the antibiotic for his sinusitis, he felt better. He reports no significant shortness of breath, no edema. He was started on amiodarone in mid May 2013. 2 weeks later, EKG showed sinus rhythm. Secondary to bradycardia, medications were changed and he was started on low-dose carvedilol 6.25 mg twice a day He does have anticoagulation/pradaxa at home and has stopped taking it at our suggestion. He will take this for emergencies only when he converts to atrial fibrillation.  Prior Echocardiogram shows atrial fibrillation, ejection fraction 35-45% with moderate global hypokinesis, Mildly dilated left atrium, and normal rvsp. Recent repeat echocardiogram confirmed ejection fraction 50-55%, mild to moderate TR, mild AI  EKG shows normal sinus rhythm with rate 46 beats per minute with ST and T wave abnormality V3 through V6, 2, 3, aVF    Outpatient Encounter Prescriptions as of 10/13/2011  Medication Sig Dispense Refill  . carvedilol (COREG) 6.25 MG tablet Take 1 tablet (6.25 mg total) by mouth 2 (two) times daily.  180 tablet  3   Review of Systems  Constitutional: Negative.   HENT: Negative.   Eyes: Negative.   Respiratory: Negative.   Cardiovascular: Negative.   Gastrointestinal: Negative.   Musculoskeletal: Negative.   Skin: Negative.   Neurological: Negative.        Rare dizziness when bending over and standing up  Hematological: Negative.   Psychiatric/Behavioral: Negative.   All other systems reviewed and are negative.    BP 118/58  Pulse 46  Ht 5'  10" (1.778 m)  Wt 192 lb 8 oz (87.317 kg)  BMI 27.62 kg/m2  Physical Exam  Nursing note and vitals reviewed. Constitutional: He is oriented to person, place, and time. He appears well-developed and well-nourished.  HENT:  Head: Normocephalic.  Nose: Nose normal.  Mouth/Throat: Oropharynx is clear and moist.  Eyes: Conjunctivae are normal. Pupils are equal, round, and reactive to light.  Neck: Normal range of motion. Neck supple. No JVD present.  Cardiovascular: Regular rhythm, S1 normal, S2 normal, normal heart sounds and intact distal pulses.  Bradycardia present.  Exam reveals no gallop and no friction rub.   No murmur heard. Pulmonary/Chest: Effort normal and breath sounds normal. No respiratory distress. He has no wheezes. He has no rales. He exhibits no tenderness.  Abdominal: Soft. Bowel sounds are normal. He exhibits no distension. There is no tenderness.  Musculoskeletal: Normal range of motion. He exhibits no edema and no tenderness.  Lymphadenopathy:    He has no cervical adenopathy.  Neurological: He is alert and oriented to person, place, and time. Coordination normal.  Skin: Skin is warm and dry. No rash noted. No erythema.  Psychiatric: He has a normal mood and affect. His behavior is normal. Judgment and thought content normal.           Assessment and Plan

## 2011-10-13 NOTE — Assessment & Plan Note (Signed)
Ejection fraction has improved back to normal on repeat echocardiogram

## 2012-04-13 ENCOUNTER — Ambulatory Visit: Payer: BC Managed Care – PPO | Admitting: Cardiovascular Disease

## 2012-04-22 ENCOUNTER — Encounter: Payer: Self-pay | Admitting: Cardiovascular Disease

## 2012-04-22 ENCOUNTER — Ambulatory Visit (INDEPENDENT_AMBULATORY_CARE_PROVIDER_SITE_OTHER): Payer: BC Managed Care – PPO | Admitting: Cardiovascular Disease

## 2012-04-22 VITALS — BP 120/68 | HR 59 | Ht 70.0 in | Wt 189.8 lb

## 2012-04-22 DIAGNOSIS — I4891 Unspecified atrial fibrillation: Secondary | ICD-10-CM

## 2012-04-22 DIAGNOSIS — E785 Hyperlipidemia, unspecified: Secondary | ICD-10-CM

## 2012-04-22 MED ORDER — CARVEDILOL 3.125 MG PO TABS: 3.1250 mg | ORAL_TABLET | Freq: Two times a day (BID) | ORAL | Status: DC

## 2012-04-22 NOTE — Progress Notes (Signed)
Patient ID: [name], male    DOB: Jun 26, 1955, 57 y.o.   MRN: [id number]  HPI Comments: [name] is a very pleasant 57 year old gentleman with no cardiac history who presented to [facility] 06/05/2011 after being seen at urgent care for sinusitis found to have atrial fibrillation with RVR. He was started on Cardizem, digoxin, pradaxa.   He was discharged on Levaquin which caused significant GI upset and vomiting. After stopping the antibiotic for his sinusitis, he felt better. He was started on amiodarone in mid May 2013. 2 weeks later, EKG showed sinus rhythm. Secondary to bradycardia, medications were changed and he was started on low-dose carvedilol 6.25 mg twice a day He does have anticoagulation/pradaxa at home and has stopped taking it at our suggestion. He will take this for emergencies only when he converts to atrial fibrillation.  He reports having some bradycardia on carvedilol and he try taking carvedilol 6.25 mg once daily. On this regimen, he had headaches at 3 in the morning and started taking the medication twice a day with resolution of his headaches. He would like to take less medication if possible. Otherwise he feels well with no recent palpitations or tachycardia for atrial fibrillation  Prior Echocardiogram shows atrial fibrillation, ejection fraction 35-45% with moderate global hypokinesis, Mildly dilated left atrium, and normal rvsp. Recent repeat echocardiogram confirmed ejection fraction 50-55%, mild to moderate TR, mild AI  EKG shows normal sinus rhythm with rate 59 beats per minute with ST and T wave abnormality V3 through V6, 2, 3, aVF , PVCs and bigeminal pattern. Rare APC   Outpatient Encounter Prescriptions as of 04/22/2012  Medication Sig Dispense Refill  . carvedilol (COREG) 6.25 MG tablet Take 1 tablet (6.25 mg total) by mouth 2 (two) times daily.  180 tablet  3    Review of Systems  Constitutional: Negative.   HENT: Negative.   Eyes: Negative.    Respiratory: Negative.   Cardiovascular: Positive for palpitations.  Gastrointestinal: Negative.   Musculoskeletal: Negative.   Skin: Negative.   Neurological: Negative.        Rare dizziness when bending over and standing up  Hematological: Negative.   Psychiatric/Behavioral: Negative.   All other systems reviewed and are negative.    BP 120/68  Pulse 59  Ht 5\' 10"  (1.778 m)  Wt 189 lb 12 oz (86.07 kg)  BMI 27.23 kg/m2  Physical Exam  Nursing note and vitals reviewed. Constitutional: He is oriented to person, place, and time. He appears well-developed and well-nourished.  HENT:  Head: Normocephalic.  Nose: Nose normal.  Mouth/Throat: Oropharynx is clear and moist.  Eyes: Conjunctivae normal are normal. Pupils are equal, round, and reactive to light.  Neck: Normal range of motion. Neck supple. No JVD present.  Cardiovascular: Regular rhythm, S1 normal, S2 normal, normal heart sounds and intact distal pulses.  Bradycardia present.  Exam reveals no gallop and no friction rub.   No murmur heard. Pulmonary/Chest: Effort normal and breath sounds normal. No respiratory distress. He has no wheezes. He has no rales. He exhibits no tenderness.  Abdominal: Soft. Bowel sounds are normal. He exhibits no distension. There is no tenderness.  Musculoskeletal: Normal range of motion. He exhibits no edema and no tenderness.  Lymphadenopathy:    He has no cervical adenopathy.  Neurological: He is alert and oriented to person, place, and time. Coordination normal.  Skin: Skin is warm and dry. No rash noted. No erythema.  Psychiatric: He has a normal mood and affect.  His behavior is normal. Judgment and thought content normal.           Assessment and Plan

## 2012-04-22 NOTE — Assessment & Plan Note (Signed)
No recent episodes of atrial fibrillation. Maintaining normal sinus rhythm. Recent sinus infection could explain his PVCs. At his suggestion, we will decrease the carvedilol to 3.125 mg twice a day. He has fatigue and bradycardia. He could even wean off the medication as prior atrial fibrillation episode likely from significant sinus infection and congestion last year 2013.

## 2012-04-22 NOTE — Patient Instructions (Addendum)
You are doing well. Please cut the coreg back to 3.215 mg twice a day  Please call us if you have new issues that need to be addressed before your next appt.  Your physician wants you to follow-up in:12 months.  You will receive a reminder letter in the mail two months in advance. If you don't receive a letter, please call our office to schedule the follow-up appointment.

## 2012-08-01 ENCOUNTER — Encounter: Payer: Self-pay | Admitting: Cardiovascular Disease

## 2012-08-01 ENCOUNTER — Ambulatory Visit (INDEPENDENT_AMBULATORY_CARE_PROVIDER_SITE_OTHER): Payer: BC Managed Care – PPO | Admitting: Cardiovascular Disease

## 2012-08-01 VITALS — BP 100/80 | HR 87 | Ht 70.0 in | Wt 191.0 lb

## 2012-08-01 DIAGNOSIS — I4891 Unspecified atrial fibrillation: Secondary | ICD-10-CM

## 2012-08-01 MED ORDER — DRONEDARONE HCL 400 MG PO TABS: 400.0000 mg | ORAL_TABLET | Freq: Two times a day (BID) | ORAL | Status: DC

## 2012-08-01 MED ORDER — ASPIRIN 81 MG PO TABS: 81.0000 mg | ORAL_TABLET | Freq: Every day | ORAL | Status: DC

## 2012-08-01 NOTE — Assessment & Plan Note (Signed)
The patient is back in atrial fibrillation but his ventricular rate is controlled. However, he appears to be significantly symptomatic when he is in atrial fibrillation. Given his low blood pressure and baseline resting bradycardia, I think it will be difficult to up titrate carvedilol or other rate controlling agents. Due to that, I think he needs to be an antiarrhythmic medications. His CHADS VAS score is 0 and thus his thromboembolic risk is overall low. I recommend starting Multaq 400 mg twice daily. He will come back in one week to followup with Dr. Mariah Milling and have a repeat EKG done. I asked him to take aspirin 81 mg once daily. If he remains in atrial fibrillation, short-term anticoagulation followed by cardioversion might be needed.

## 2012-08-01 NOTE — Patient Instructions (Addendum)
Start Multaq 400 mg twice daily with a meal.  Start Aspirin 81 mg once daily.  Continue Carvedilol twice daily unless your heart rate goes below 60  Follow up in 1 week with an EKG (Dr. Mariah Milling)

## 2012-08-01 NOTE — Progress Notes (Signed)
HPI  Mr. Moten is a very pleasant 57 year old gentleman with no cardiac history who presented to [facility] 06/05/2011 after being seen at urgent care for sinusitis found to have atrial fibrillation with RVR. He was started on Cardizem, digoxin, pradaxa.  He was started on amiodarone in mid May 2013. 2 weeks later, EKG showed sinus rhythm. 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 It's not unusual for him to have a resting heart rate of less than 60 beats per minute.  Prior Echocardiogram in 2013 showed  ejection fraction 35-45% with moderate global hypokinesis, Mildly dilated left atrium, and normal rvsp. Recent repeat echocardiogram after restoring sinus rhythm confirmed ejection fraction 50-55%, mild to moderate TR, mild AI.  He was seen most recently in February and was noted to be in normal sinus rhythm. He did have frequent PVCs. He noticed palpitations, dyspnea and fatigue over the last 2 days similar to his previous episodes of atrial fibrillation. He is today noted to be in atrial fibrillation and was added to my schedule today. He has no history of diabetes, hypertension, congestive heart failure or stroke.   No Known Allergies   Current Outpatient Prescriptions on File Prior to Visit  Medication Sig Dispense Refill  . carvedilol (COREG) 3.125 MG tablet Take 1 tablet (3.125 mg total) by mouth 2 (two) times daily.  180 tablet  3   No current facility-administered medications on file prior to visit.     Past Medical History  Diagnosis Date  . Atrial fibrillation      Past Surgical History  Procedure Laterality Date  . Tendon exploration    . Biceps tendon repair       Family History  Problem Relation Age of Onset  . Hypertension Father   . Dementia Father       History   Social History  . Marital Status: Married    Spouse Name: N/A    Number of Children: N/A  . Years of Education: N/A   Occupational History  . Not on file.   Social History Main Topics  . Smoking status: Never Smoker   . Smokeless tobacco: Not on file  . Alcohol Use: 0.5 oz/week    1 drink(s) per week  . Drug Use: No  . Sexually Active: Not on file   Other Topics Concern  . Not on file   Social History Narrative  . No narrative on file     PHYSICAL EXAM   BP 100/80  Pulse 87  Ht 5\' 10"  (1.778 m)  Wt 191 lb (86.637 kg)  BMI 27.41 kg/m2 Constitutional: He is oriented to person, place, and time. He appears well-developed and well-nourished. No distress.  HENT: No nasal discharge.  Head: Normocephalic and atraumatic.  Eyes: Pupils are equal and round. Right eye exhibits no discharge. Left eye exhibits no discharge.  Neck: Normal range of motion. Neck supple. No JVD present. No thyromegaly present.  Cardiovascular: Normal rate, irregular rhythm, normal heart sounds and. Exam reveals no gallop and no friction rub. No murmur heard.  Pulmonary/Chest: Effort normal and breath sounds normal. No stridor. No respiratory distress. He has no wheezes. He has no rales. He exhibits no tenderness.  Abdominal: Soft. Bowel sounds are normal. He exhibits no distension. There is no tenderness. There is  no rebound and no guarding.  Musculoskeletal: Normal range of motion. He exhibits no edema and no tenderness.  Neurological: He is alert and oriented to person, place, and time. Coordination normal.  Skin: Skin is warm and dry. No rash noted. He is not diaphoretic. No erythema. No pallor.  Psychiatric: He has a normal mood and affect. His behavior is normal. Judgment and thought content normal.       WUJ:WJXBJY fibrillation  Nonspecific ST changes.  ABNORMAL    ASSESSMENT AND PLAN

## 2012-08-09 ENCOUNTER — Ambulatory Visit (INDEPENDENT_AMBULATORY_CARE_PROVIDER_SITE_OTHER): Payer: BC Managed Care – PPO

## 2012-08-09 VITALS — BP 116/74 | HR 85 | Ht 70.0 in | Wt 190.5 lb

## 2012-08-09 DIAGNOSIS — I4891 Unspecified atrial fibrillation: Secondary | ICD-10-CM

## 2012-08-09 NOTE — Patient Instructions (Addendum)
We will call you with Dr. Windell Hummingbird decision re: anticoagulation and cardioversion.

## 2012-08-09 NOTE — Progress Notes (Signed)
Pt here for EKG at Dr. Jari Sportsman request Will review with Dr. Kirke Corin and call pt with instructions.  Pt states he is feeling better since starting Multaq  Discussed with Dr. Kirke Corin who says pt may need cardioversion, but needs to discus with Dr. Mariah Milling what type of anticoagulation to give pt I reassured pt I would give info to Dr. Mariah Milling and we would discuss cardioversion/anticoagulation and call him back at [phone] or [phone] Understanding verb

## 2012-08-15 ENCOUNTER — Telehealth: Payer: Self-pay

## 2012-08-15 ENCOUNTER — Other Ambulatory Visit: Payer: Self-pay

## 2012-08-15 MED ORDER — DABIGATRAN ETEXILATE MESYLATE 150 MG PO CAPS: 150.0000 mg | ORAL_CAPSULE | Freq: Two times a day (BID) | ORAL | Status: DC

## 2012-08-15 NOTE — Telephone Encounter (Signed)
Message copied by Marcelle Overlie on Mon Aug 15, 2012  8:25 AM ------      Message from: Antonieta Iba      Created: Fri Aug 12, 2012  6:10 PM       Would restart pradaxa twice a day      Cardioversion in one month if still in atrial fibrillation      I can see him in clinic he would like in preparation in several weeks' time      thx      Tim            ----- Message -----         From: Marcelle Overlie, RN         Sent: 08/09/2012   4:39 PM           To: Antonieta Iba, MD            Please see note from today 5/27 and advise      thanks       ------

## 2012-08-15 NOTE — Telephone Encounter (Signed)
Pt informed Understanding verb Samples of Pradaxa left at FD appt made with Dr. Mariah Milling for 09/13/12 at pt's request

## 2012-08-15 NOTE — Telephone Encounter (Signed)
See below

## 2012-08-16 ENCOUNTER — Telehealth: Payer: Self-pay | Admitting: *Deleted

## 2012-08-16 NOTE — Telephone Encounter (Signed)
Would like RN to call him back regarding the pradaxa and if he should continue taking the other medications as well.

## 2012-08-17 NOTE — Telephone Encounter (Signed)
Pt wanted to clarify if he should continue taking carvedilol and multaq since starting on pradaxa I advised continue other meds as prescribed Understanding verb

## 2012-09-13 ENCOUNTER — Ambulatory Visit (INDEPENDENT_AMBULATORY_CARE_PROVIDER_SITE_OTHER): Payer: BC Managed Care – PPO | Admitting: Cardiovascular Disease

## 2012-09-13 ENCOUNTER — Encounter: Payer: Self-pay | Admitting: Cardiovascular Disease

## 2012-09-13 VITALS — BP 120/80 | HR 51 | Ht 70.0 in | Wt 193.5 lb

## 2012-09-13 DIAGNOSIS — I4891 Unspecified atrial fibrillation: Secondary | ICD-10-CM

## 2012-09-13 NOTE — Assessment & Plan Note (Signed)
He is converted back to normal sinus rhythm. We have suggested he stay on carvedilol and multaq if he would like to stop anticoagulation, we have suggested he purchase a pulse meter and closely monitor his heart rhythm.  In general he has mild symptoms only when in atrial fibrillation.

## 2012-09-13 NOTE — Progress Notes (Signed)
Patient ID: [name], male    DOB: 1955/07/30, 57 y.o.   MRN: [id number]  HPI Comments: [name] is a very pleasant 57 year old gentleman with no cardiac history, history of bradycardia,  who presented to [facility] 06/05/2011 after being seen at urgent care for sinusitis found to have atrial fibrillation with RVR. He was started on Cardizem, digoxin, pradaxa.   He was started on amiodarone in mid May 2013. 2 weeks later, EKG showed sinus rhythm. Secondary to bradycardia, medications were changed and he was started on low-dose carvedilol 6.25 mg twice a day which was subsequently decreased to 3.125 mg twice daily   Prior Echocardiogram in 2013 showed  ejection fraction 35-45% with moderate global hypokinesis, Mildly dilated left atrium, and normal rvsp. repeat echocardiogram after restoring sinus rhythm confirmed ejection fraction 50-55%, mild to moderate TR, mild AI.    in February 2014  was noted to be in normal sinus rhythm. He did have frequent PVCs.  He has had a day to further episodes of atrial fibrillation, last seen by Dr. Kirke Corin several weeks ago and started on Multaq.   He feels well today in followup EKG shows normal sinus rhythm with rate 51 beats minute, nonspecific ST and T wave abnormality     Outpatient Encounter Prescriptions as of 09/13/2012  Medication Sig Dispense Refill  . aspirin 81 MG tablet Take 1 tablet (81 mg total) by mouth daily.  30 tablet    . carvedilol (COREG) 3.125 MG tablet Take 1 tablet (3.125 mg total) by mouth 2 (two) times daily.  180 tablet  3  . dabigatran (PRADAXA) 150 MG CAPS Take 1 capsule (150 mg total) by mouth every 12 (twelve) hours.  60 capsule  0  . dronedarone (MULTAQ) 400 MG tablet Take 1 tablet (400 mg total) by mouth 2 (two) times daily with a meal.  60 tablet  6   No facility-administered encounter medications on file as of 09/13/2012.     Review of Systems  Constitutional: Negative.   HENT: Negative.   Eyes: Negative.    Respiratory: Negative.   Cardiovascular: Negative.   Gastrointestinal: Negative.   Musculoskeletal: Negative.   Skin: Negative.   Neurological: Negative.   Psychiatric/Behavioral: Negative.   All other systems reviewed and are negative.     BP 120/80  Pulse 51  Ht 5\' 10"  (1.778 m)  Wt 193 lb 8 oz (87.771 kg)  BMI 27.76 kg/m2  Physical Exam  Nursing note and vitals reviewed. Constitutional: He is oriented to person, place, and time. He appears well-developed and well-nourished.  HENT:  Head: Normocephalic.  Nose: Nose normal.  Mouth/Throat: Oropharynx is clear and moist.  Eyes: Conjunctivae are normal. Pupils are equal, round, and reactive to light.  Neck: Normal range of motion. Neck supple. No JVD present.  Cardiovascular: Normal rate, regular rhythm, S1 normal, S2 normal, normal heart sounds and intact distal pulses.  Exam reveals no gallop and no friction rub.   No murmur heard. Pulmonary/Chest: Effort normal and breath sounds normal. No respiratory distress. He has no wheezes. He has no rales. He exhibits no tenderness.  Abdominal: Soft. Bowel sounds are normal. He exhibits no distension. There is no tenderness.  Musculoskeletal: Normal range of motion. He exhibits no edema and no tenderness.  Lymphadenopathy:    He has no cervical adenopathy.  Neurological: He is alert and oriented to person, place, and time. Coordination normal.  Skin: Skin is warm and dry. No rash noted. No erythema.  Psychiatric: He has a normal mood and affect. His behavior is normal. Judgment and thought content normal.      Assessment and Plan

## 2012-09-13 NOTE — Patient Instructions (Addendum)
You are doing well. Hold the pradaxa Consider buying a pulse Ox meter Continue the coreg and multaq  Restart pradaxa if you are in atrial fibrillation , call the office for EKG is it does not convert back to normal rhythym  Please call us if you have new issues that need to be addressed before your next appt.  Your physician wants you to follow-up in: 6 months.  You will receive a reminder letter in the mail two months in advance. If you don't receive a letter, please call our office to schedule the follow-up appointment.

## 2013-03-15 ENCOUNTER — Encounter: Payer: Self-pay | Admitting: Cardiovascular Disease

## 2013-03-15 ENCOUNTER — Ambulatory Visit (INDEPENDENT_AMBULATORY_CARE_PROVIDER_SITE_OTHER): Payer: BC Managed Care – PPO | Admitting: Cardiovascular Disease

## 2013-03-15 VITALS — BP 130/70 | HR 107 | Ht 70.0 in | Wt 189.5 lb

## 2013-03-15 DIAGNOSIS — I519 Heart disease, unspecified: Secondary | ICD-10-CM

## 2013-03-15 DIAGNOSIS — I4891 Unspecified atrial fibrillation: Secondary | ICD-10-CM

## 2013-03-15 MED ORDER — APIXABAN 5 MG PO TABS: 5.0000 mg | ORAL_TABLET | Freq: Two times a day (BID) | ORAL | Status: DC

## 2013-03-15 MED ORDER — CARVEDILOL 6.25 MG PO TABS: 6.2500 mg | ORAL_TABLET | Freq: Two times a day (BID) | ORAL | Status: DC

## 2013-03-15 NOTE — Progress Notes (Signed)
Patient ID: [name], male    DOB: May 18, 1955, 57 y.o.   MRN: [id number]  HPI Comments: [name] is a very pleasant 57 year old gentleman with  history of paroxysmal atrial fibrillation  who presented to [facility] 06/05/2011 after being seen at urgent care for sinusitis found to have atrial fibrillation with RVR. He was started on Cardizem, digoxin, pradaxa.   started on amiodarone in mid May 2013. 2 weeks later, EKG showed sinus rhythm. Secondary to bradycardia, medications were changed and he was started on low-dose carvedilol 6.25 mg twice a day which was subsequently decreased to 3.125 mg twice daily Prior Echocardiogram in 2013 showed  ejection fraction 35-45% with moderate global hypokinesis, Mildly dilated left atrium, and normal rvsp. repeat echocardiogram after restoring sinus rhythm confirmed ejection fraction 50-55%, mild to moderate TR, mild AI.    in February 2014  was noted to be in normal sinus rhythm.  Last clinic visit seen by Dr. Kirke Corin  and started on Multaq for recurrent atrial fibrillation.  Converted to normal sinus rhythm  In followup today, he reports having one month of shortness of breath, concern he was in atrial fibrillation. He does have anticoagulation at home but did not start this. He has continued on Coreg 3.125 mg twice a day. He has not been monitoring his heart rate at home.   EKG today shows atrial fibrillation with ventricular rate 107 beats per minute, ST and T wave abnormality through the anterior precordial leads, lateral leads, inferior leads     Outpatient Encounter Prescriptions as of 03/15/2013  Medication Sig  . aspirin 81 MG tablet Take 81 mg by mouth 2 (two) times daily.  . carvedilol (COREG) 3.125 MG tablet Take 1 tablet (3.125 mg total) by mouth 2 (two) times daily.  Marland Kitchen dronedarone (MULTAQ) 400 MG tablet Take 1 tablet (400 mg total) by mouth 2 (two) times daily with a meal.    Review of Systems  Constitutional: Negative.   HENT:  Negative.   Eyes: Negative.   Respiratory: Negative.   Cardiovascular: Negative.   Gastrointestinal: Negative.   Endocrine: Negative.   Musculoskeletal: Negative.   Skin: Negative.   Allergic/Immunologic: Negative.   Neurological: Negative.   Hematological: Negative.   Psychiatric/Behavioral: Negative.   All other systems reviewed and are negative.     BP 130/70  Pulse 107  Ht 5\' 10"  (1.778 m)  Wt 189 lb 8 oz (85.957 kg)  BMI 27.19 kg/m2  Physical Exam  Nursing note and vitals reviewed. Constitutional: He is oriented to person, place, and time. He appears well-developed and well-nourished.  HENT:  Head: Normocephalic.  Nose: Nose normal.  Mouth/Throat: Oropharynx is clear and moist.  Eyes: Conjunctivae are normal. Pupils are equal, round, and reactive to light.  Neck: Normal range of motion. Neck supple. No JVD present.  Cardiovascular: Normal rate, S1 normal, S2 normal, normal heart sounds and intact distal pulses.  An irregularly irregular rhythm present. Exam reveals no gallop and no friction rub.   No murmur heard. Pulmonary/Chest: Effort normal and breath sounds normal. No respiratory distress. He has no wheezes. He has no rales. He exhibits no tenderness.  Abdominal: Soft. Bowel sounds are normal. He exhibits no distension. There is no tenderness.  Musculoskeletal: Normal range of motion. He exhibits no edema and no tenderness.  Lymphadenopathy:    He has no cervical adenopathy.  Neurological: He is alert and oriented to person, place, and time. Coordination normal.  Skin: Skin is warm and dry.  No rash noted. No erythema.  Psychiatric: He has a normal mood and affect. His behavior is normal. Judgment and thought content normal.      Assessment and Plan        will

## 2013-03-15 NOTE — Assessment & Plan Note (Signed)
Converted back to atrial fibrillation approximately one month ago. No precipitating cause. We will start him on eliquis 5 mg twice a day, increase the Coreg up to 6.25 mg twice a day. If he has not converted in one month, could consider retrying amiodarone.   we will continue his multaq twice a day with food .

## 2013-03-15 NOTE — Assessment & Plan Note (Signed)
He does report some shortness of breath with exertion. This is likely from new atrial fibrillation

## 2013-03-15 NOTE — Patient Instructions (Addendum)
Please increase the coreg/cardvedilol up to 6.25 mg twice a day If the heart rate continues to run fast after one week, Increase the dose to 12.5 mg twice a day   Please start eliquis twice a day (blood thinner)  Go to the app store: search pulse meter Download "instant heart rate", "cardiograph"  Please call us if you have new issues that need to be addressed before your next appt.  Your physician wants you to follow-up in: 1 month.

## 2013-04-03 ENCOUNTER — Other Ambulatory Visit: Payer: Self-pay | Admitting: Cardiovascular Disease

## 2013-04-25 ENCOUNTER — Ambulatory Visit (INDEPENDENT_AMBULATORY_CARE_PROVIDER_SITE_OTHER): Payer: BC Managed Care – PPO | Admitting: Cardiovascular Disease

## 2013-04-25 ENCOUNTER — Encounter: Payer: Self-pay | Admitting: Cardiovascular Disease

## 2013-04-25 VITALS — BP 98/72 | HR 79 | Ht 70.0 in | Wt 191.5 lb

## 2013-04-25 DIAGNOSIS — I4891 Unspecified atrial fibrillation: Secondary | ICD-10-CM

## 2013-04-25 DIAGNOSIS — I519 Heart disease, unspecified: Secondary | ICD-10-CM

## 2013-04-25 DIAGNOSIS — R0602 Shortness of breath: Secondary | ICD-10-CM

## 2013-04-25 MED ORDER — AMIODARONE HCL 200 MG PO TABS
200.0000 mg | ORAL_TABLET | Freq: Two times a day (BID) | ORAL | Status: DC
Start: 2013-04-25 — End: 2013-05-12

## 2013-04-25 NOTE — Assessment & Plan Note (Signed)
In the past has had systolic dysfunction wall in atrial fibrillation. EF was normal in normal sinus rhythm

## 2013-04-25 NOTE — Patient Instructions (Addendum)
Please start amiodarone 400 mg twice a day for 5 days Then decrease the dose down to 200 mg twice  A day Please take coreg 6.25 mg twice a day (please increase to 9.375 mg twice a day for heart rate >80) Come in for an ekg in 2 weeks  If rhythm seems to have converted before 2 weeks, call the office for an ekg   Please call us if you have new issues that need to be addressed before your next appt.  Your physician wants you to follow-up in: EKG in 2 weeks

## 2013-04-25 NOTE — Progress Notes (Signed)
Patient ID: [name], male    DOB: 1955/11/11, 58 y.o.   MRN: [id number]  HPI Comments: [name] is a very pleasant 58 year old gentleman with  history of paroxysmal atrial fibrillation  who presented to [facility] 06/05/2011 after being seen at urgent care for sinusitis found to have atrial fibrillation with RVR. He was started on Cardizem, digoxin, pradaxa.   started on amiodarone in mid May 2013. 2 weeks later, EKG showed sinus rhythm. Secondary to bradycardia, medications were changed and he was started on low-dose carvedilol 6.25 mg twice a day which was subsequently decreased to 3.125 mg twice daily Prior Echocardiogram in 2013 showed  ejection fraction 35-45% with moderate global hypokinesis, Mildly dilated left atrium, and normal rvsp. repeat echocardiogram after restoring sinus rhythm confirmed ejection fraction 50-55%, mild to moderate TR, mild AI.    in February 2014  was noted to be in normal sinus rhythm.   seen by Dr. Kirke Corin  and started on Multaq for recurrent atrial fibrillation. He Converted to normal sinus rhythm  On his last clinic visit, he was back in atrial fibrillation. He was having shortness of breath at rest and with exertion. He was taking carvedilol 3.125 mg twice a day He was started on eliquis 5 mg twice a day one month ago and presents today on carvedilol 9.375 mg twice a day. He continues to be in atrial fibrillation but heart rate has improved. He continues to have shortness of breath at rest and with exertion. He denies any leg edema  EKG today shows atrial fibrillation with ventricular rate 79 beats per minute, ST and T wave abnormality through the anterior precordial leads, lateral leads, inferior leads     Outpatient Encounter Prescriptions as of 04/25/2013  Medication Sig  . apixaban (ELIQUIS) 5 MG TABS tablet Take 1 tablet (5 mg total) by mouth 2 (two) times daily.  . carvedilol (COREG) 6.25 MG tablet Take 1 tablet (6.25 mg total) by mouth 2 (two) times  daily.  . MULTAQ 400 MG tablet TAKE 1 TABLET BY MOUTH TWICE A DAY WITH A MEAL  . [DISCONTINUED] aspirin 81 MG tablet Take 81 mg by mouth 2 (two) times daily.     Review of Systems  Constitutional: Negative.   HENT: Negative.   Eyes: Negative.   Respiratory: Positive for shortness of breath.   Cardiovascular: Negative.   Gastrointestinal: Negative.   Endocrine: Negative.   Musculoskeletal: Negative.   Skin: Negative.   Allergic/Immunologic: Negative.   Neurological: Negative.   Hematological: Negative.   Psychiatric/Behavioral: Negative.   All other systems reviewed and are negative.    BP 98/72  Pulse 79  Ht 5\' 10"  (1.778 m)  Wt 191 lb 8 oz (86.864 kg)  BMI 27.48 kg/m2 Repeat blood pressure by myself with systolic pressure 105 Physical Exam  Nursing note and vitals reviewed. Constitutional: He is oriented to person, place, and time. He appears well-developed and well-nourished.  HENT:  Head: Normocephalic.  Nose: Nose normal.  Mouth/Throat: Oropharynx is clear and moist.  Eyes: Conjunctivae are normal. Pupils are equal, round, and reactive to light.  Neck: Normal range of motion. Neck supple. No JVD present.  Cardiovascular: Normal rate, S1 normal, S2 normal, normal heart sounds and intact distal pulses.  An irregularly irregular rhythm present. Exam reveals no gallop and no friction rub.   No murmur heard. Pulmonary/Chest: Effort normal and breath sounds normal. No respiratory distress. He has no wheezes. He has no rales. He exhibits no tenderness.  Abdominal: Soft. Bowel sounds are normal. He exhibits no distension. There is no tenderness.  Musculoskeletal: Normal range of motion. He exhibits no edema and no tenderness.  Lymphadenopathy:    He has no cervical adenopathy.  Neurological: He is alert and oriented to person, place, and time. Coordination normal.  Skin: Skin is warm and dry. No rash noted. No erythema.  Psychiatric: He has a normal mood and affect. His  behavior is normal. Judgment and thought content normal.      Assessment and Plan        will

## 2013-04-25 NOTE — Assessment & Plan Note (Signed)
We will hold his multaq,  And start amiodarone 400 mg twice a day for 5 days, then down to 200 mg twice a day with EKG in 2 weeks' time. We have suggested he continue carvedilol 6.125 mg twice a day. If he does not convert to normal sinus rhythm, he may need a cardioversion

## 2013-05-03 ENCOUNTER — Telehealth: Payer: Self-pay

## 2013-05-03 MED ORDER — CARVEDILOL 3.125 MG PO TABS: 3.1250 mg | ORAL_TABLET | Freq: Two times a day (BID) | ORAL | Status: DC

## 2013-05-03 NOTE — Telephone Encounter (Signed)
Spoke w/ pt.  Advised him of Dr. Windell HummingbirdGollan's recommendations.  He is agreeable to change in meds and will call the office if heart rate remains low.

## 2013-05-03 NOTE — Telephone Encounter (Signed)
Pt called his HR is 38, and has been a couple of days. Asks if he should stop his medication. Please advise.

## 2013-05-03 NOTE — Telephone Encounter (Signed)
Spoke w/ pt.  He reports that his heart rate has been around 38-39 bpm since Saturday. He dropped his amiodarone dose to 200mg  BID on Monday, is currently taking coreg 6.25mg  BID. Reports that he feels fine, but a little tired.  He took both of these meds this am. Would like to know if he should continue the amiodarone, as he states that he is no longer in afib. Please advise.  Thank you.

## 2013-05-03 NOTE — Telephone Encounter (Signed)
Great that it converted back to NSR Would cut amiodarone back to 200 mg daily Cut coreg back to 3.125 mg twice a day Would monitor heart rate

## 2013-05-12 ENCOUNTER — Ambulatory Visit (INDEPENDENT_AMBULATORY_CARE_PROVIDER_SITE_OTHER): Payer: BC Managed Care – PPO | Admitting: *Deleted

## 2013-05-12 ENCOUNTER — Encounter: Payer: Self-pay | Admitting: Cardiovascular Disease

## 2013-05-12 ENCOUNTER — Ambulatory Visit (INDEPENDENT_AMBULATORY_CARE_PROVIDER_SITE_OTHER): Payer: BC Managed Care – PPO | Admitting: Cardiovascular Disease

## 2013-05-12 VITALS — BP 132/70 | HR 39 | Ht 70.0 in | Wt 191.5 lb

## 2013-05-12 DIAGNOSIS — I498 Other specified cardiac arrhythmias: Secondary | ICD-10-CM

## 2013-05-12 DIAGNOSIS — I4891 Unspecified atrial fibrillation: Secondary | ICD-10-CM

## 2013-05-12 NOTE — Progress Notes (Signed)
Patient ID: [name], male    DOB: 04/28/1955, 58 y.o.   MRN: [id number]  HPI Comments: [name] is a very pleasant 58 year old gentleman with  history of paroxysmal atrial fibrillation  who presented to [facility] 06/05/2011 after being seen at urgent care for sinusitis found to have atrial fibrillation with RVR. He was started on Cardizem, digoxin, pradaxa.   started on amiodarone in mid May 2013. 2 weeks later, EKG showed sinus rhythm. Secondary to bradycardia, medications were changed and he was started on low-dose carvedilol 6.25 mg twice a day which was subsequently decreased to 3.125 mg twice daily Prior Echocardiogram in 2013 showed  ejection fraction 35-45% with moderate global hypokinesis, Mildly dilated left atrium, and normal rvsp. repeat echocardiogram after restoring sinus rhythm confirmed ejection fraction 50-55%, mild to moderate TR, mild AI.    in February 2014  was noted to be in normal sinus rhythm.   seen by Dr. Kirke CorinArida  and started on Multaq for recurrent atrial fibrillation. He Converted to normal sinus rhythm  At the end of 2014, he was back in atrial fibrillation. He was having shortness of breath at rest and with exertion. He was taking carvedilol 3.125 mg twice a day He was started on eliquis 5 mg twice a day,  After one month we started high-dose amiodarone in an effort to cardiovert. This was successful on May 03 2013 he converted to normal sinus rhythm her heart rate was in the high 30s.  Informed call followup, we suggested he decrease amiodarone down to 200 mg daily, Coreg 3.125 mg twice a day. He has been 10 days since then and his heart rate today continues to be in the high 30s. 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 amiodarone (PACERONE) 200 MG tablet Take 200 mg by mouth daily.  Marland Kitchen. apixaban (ELIQUIS) 5 MG TABS tablet Take 1 tablet (5 mg total) by mouth 2 (two) times daily.  . carvedilol (COREG) 3.125 MG tablet Take 1 tablet (3.125 mg total) by mouth 2 (two) times daily.  . [DISCONTINUED] amiodarone (PACERONE) 200 MG tablet Take 1 tablet (200 mg total) by mouth 2 (two) times daily.     Review of Systems  Constitutional: Negative.   HENT: Negative.   Eyes: Negative.   Respiratory: Positive for shortness of breath.   Cardiovascular: Negative.   Gastrointestinal: Negative.   Endocrine: Negative.   Musculoskeletal: Negative.   Skin: Negative.   Allergic/Immunologic: Negative.   Neurological: Negative.   Hematological: Negative.   Psychiatric/Behavioral: Negative.   All other systems reviewed and are negative.    BP 132/70  Pulse 39  Ht 5\' 10"  (1.778 m)  Wt 191 lb 8 oz (86.864 kg)  BMI 27.48 kg/m2  Physical Exam  Nursing note and vitals reviewed. Constitutional: He is oriented to person, place, and time. He appears well-developed and well-nourished.  HENT:  Head: Normocephalic.  Nose: Nose normal.  Mouth/Throat: Oropharynx is clear and moist.  Eyes: Conjunctivae are normal. Pupils are equal, round, and reactive to light.  Neck: Normal range of motion. Neck supple. No JVD present.  Cardiovascular: S1 normal, S2 normal, normal heart sounds and intact distal pulses.  An irregularly irregular rhythm present. Bradycardia present.  Exam reveals no gallop and no friction rub.   No murmur heard. Pulmonary/Chest: Effort normal and breath sounds normal. No respiratory distress. He has no wheezes. He has no rales. He exhibits no tenderness.  Abdominal: Soft. Bowel sounds are normal. He exhibits no distension. There is no tenderness.   Musculoskeletal: Normal range of motion. He exhibits no edema and no tenderness.  Lymphadenopathy:    He has no cervical adenopathy.  Neurological: He is alert and oriented to person, place, and time. Coordination normal.  Skin: Skin is warm and dry. No rash noted. No erythema.  Psychiatric: He has a normal mood and affect. His behavior is normal. Judgment and thought content normal.      Assessment and Plan        will

## 2013-05-12 NOTE — Patient Instructions (Signed)
Your pulse is very slow today Please cut the amiodarone down to 100 mg once a day Cut the coreg down to 3.125 mg once a day  If your heart Rate continues to run slow in the next 1 to 2 weeks, Take amiodarone 100 mg every other day  Goal heart rate >50   Please call us if you have new issues that need to be addressed before your next appt.  Your physician wants you to follow-up in: 6 months.

## 2013-05-12 NOTE — Assessment & Plan Note (Addendum)
We have recommended that he decrease his amiodarone down to 100 mg daily, Coreg down to 3.125 mg daily. If he continues to have significant bradycardia after one to 2 weeks, we would take amiodarone 100 mg every other day. We have recommended that he take elquis for 2 more weeks then change to aspirin. He we'll closely monitor his rhythm for atrial fibrillation

## 2013-05-13 IMAGING — CR DG CHEST 2V
1 series · 2 of 2 positions shown · non-contrast
Comparison: none

REASON FOR EXAM: tachy
COMMENTS:   May transport without cardiac monitor

[Series 1: w chest pa · 0.14mm/px · 2 of 2 slices shown]
[im 1/2]
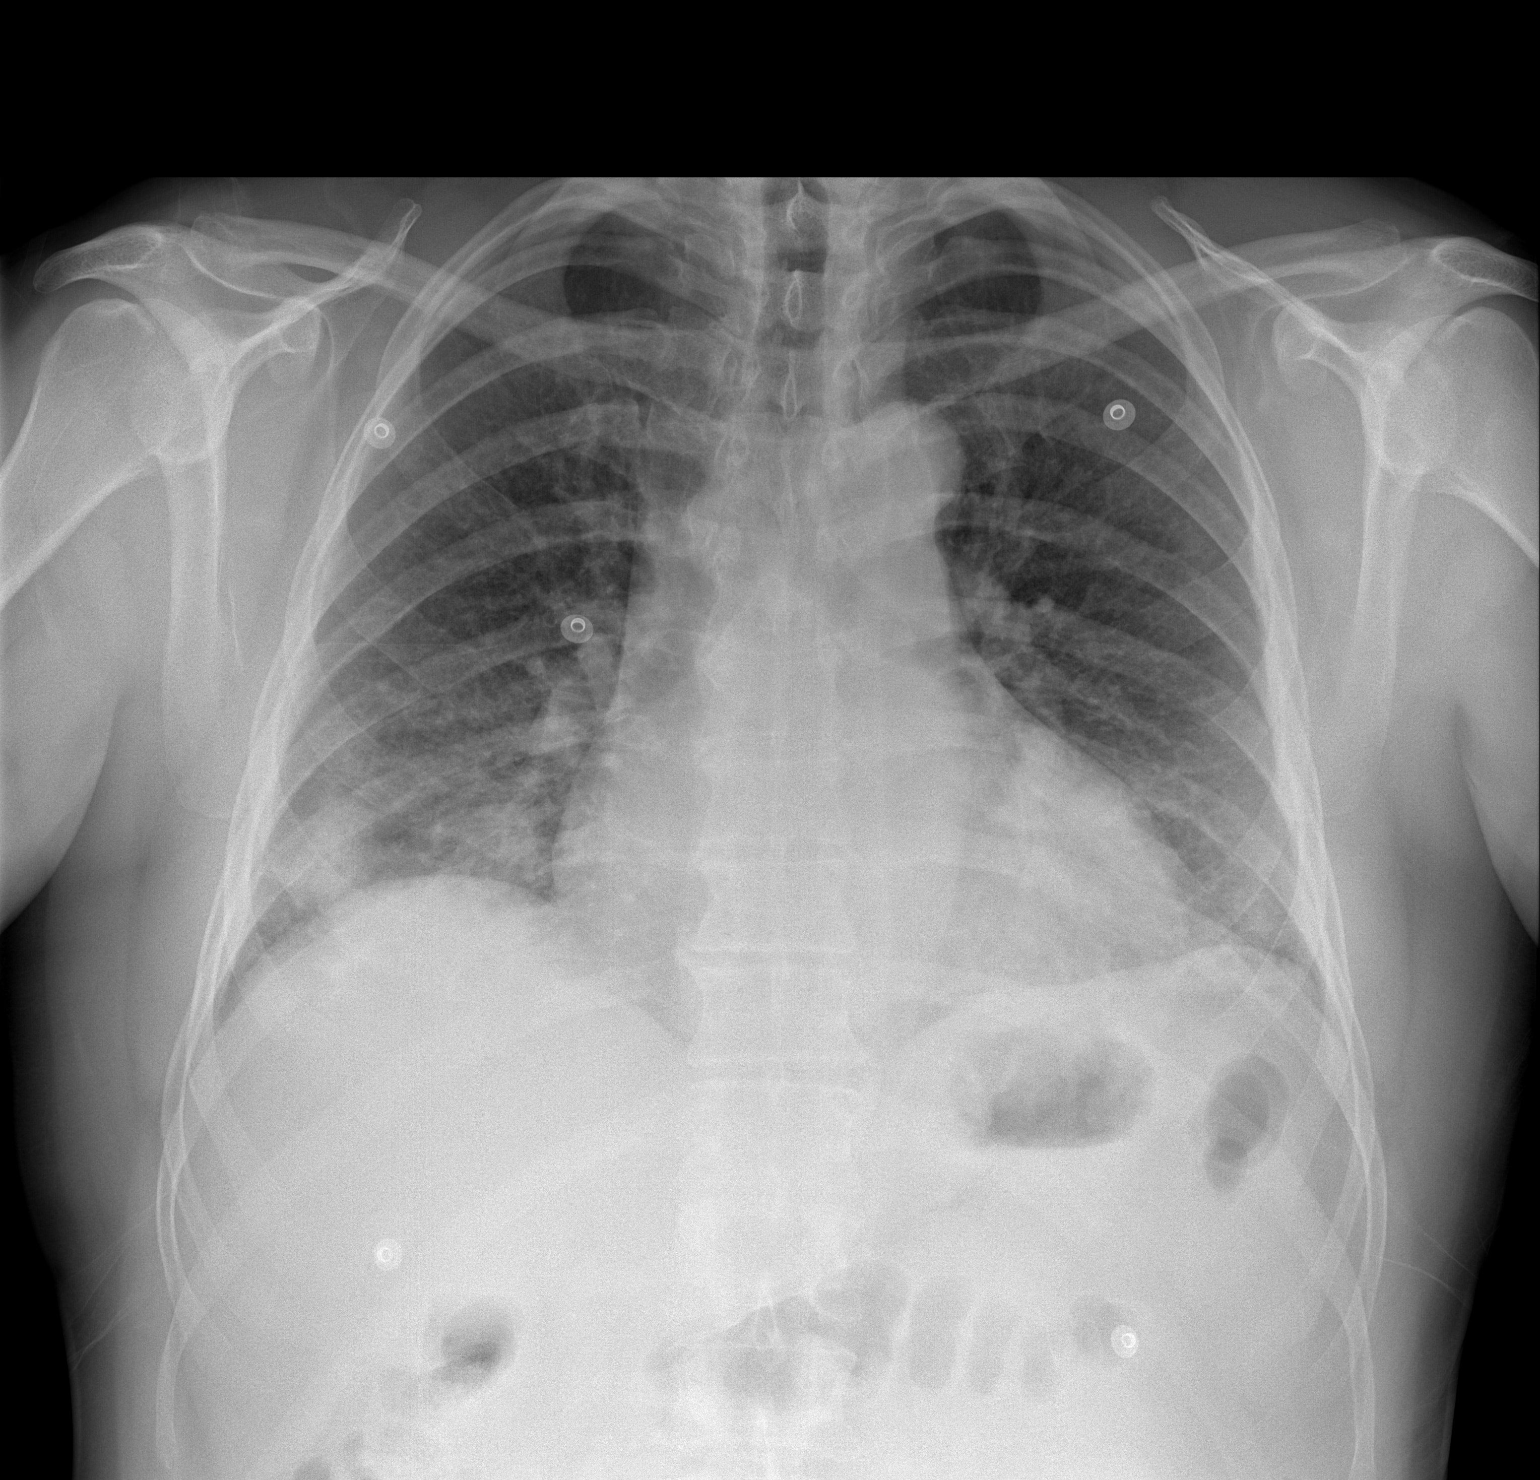
[im 2/2]
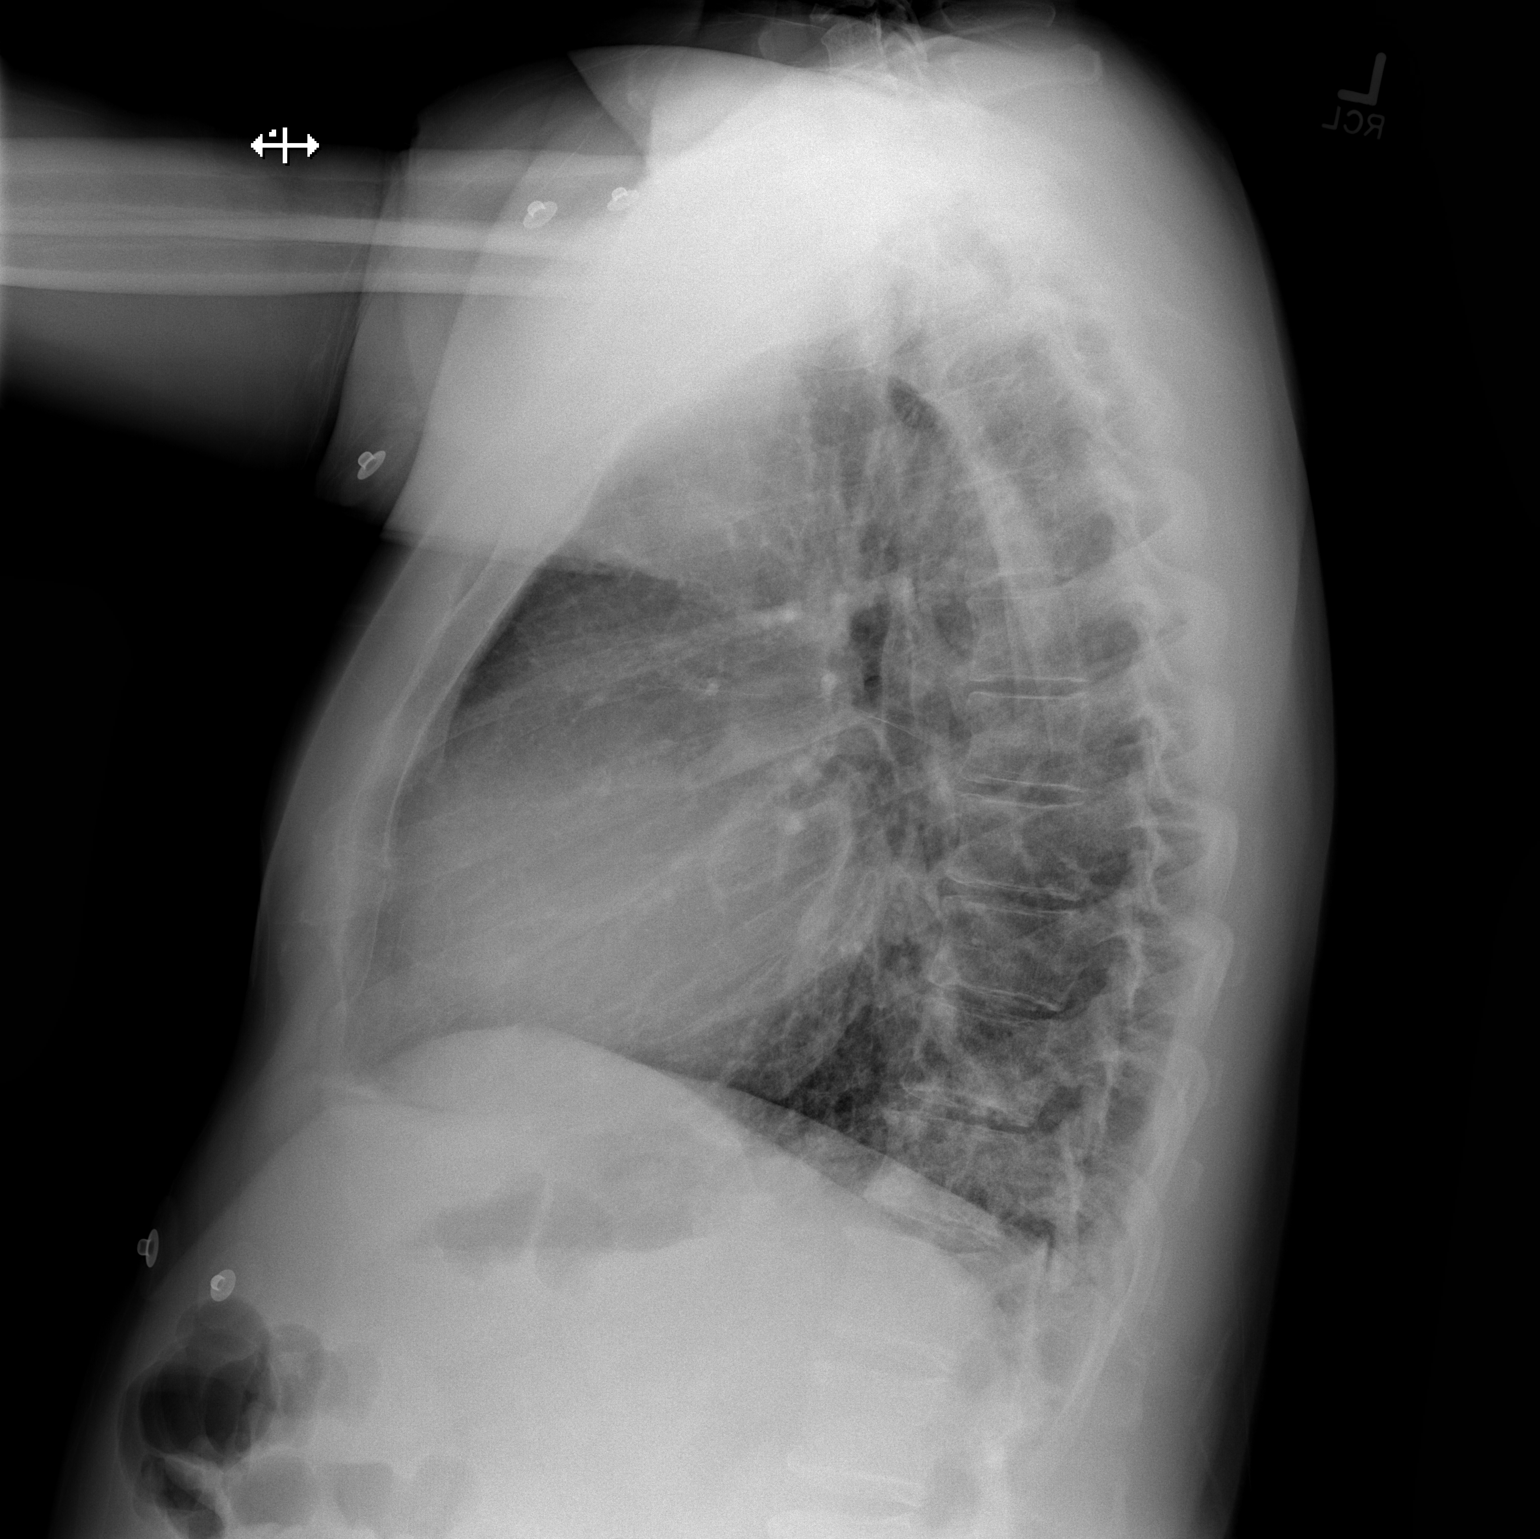

[2 of 2 positions shown; findings below may reference images not displayed]

PROCEDURE:     DXR - DXR CHEST PA (OR AP) AND LATERAL  - June 04, 2011  [DATE]

RESULT:     There is no previous exam for comparison.

There is increased density at the right lung base and, to a lesser extent
the left lung base. No effusion, edema or pneumothorax is seen. The cardiac
silhouette is borderline enlarged.
IMPRESSION: 1. Right lung base pneumonia. Atelectasis is felt to be less likely. Minimal
left lung base atelectasis present.

## 2014-04-17 ENCOUNTER — Other Ambulatory Visit: Payer: Self-pay | Admitting: Cardiovascular Disease

## 2014-05-12 ENCOUNTER — Other Ambulatory Visit: Payer: Self-pay | Admitting: Cardiovascular Disease

## 2014-06-05 ENCOUNTER — Other Ambulatory Visit: Payer: Self-pay | Admitting: Cardiovascular Disease

## 2014-06-26 ENCOUNTER — Telehealth: Payer: Self-pay | Admitting: *Deleted

## 2014-06-26 NOTE — Telephone Encounter (Signed)
Pt c/o medication issue:  1. Name of Medication: Amiodarone  Carvedilol 2. How are you currently taking this medication (dosage and times per day)? yes  3. Are you having a reaction (difficulty breathing--STAT)? No   4. What is your medication issue? Been several months since he has seen us and, he wants to know what each does. He wanted to know what each controls. Please advise.

## 2014-06-26 NOTE — Telephone Encounter (Signed)
Pt is way overdue for a f/u.  He will need an appt to go over his meds.

## 2014-07-08 NOTE — Consult Note (Signed)
General Aspect 59 year old male with no significant cardiac hx, presenting with atrial fibrillation with RVR. Cardiology was consulted for atrial fib.  [name], he went to the [facility] clinic for treatment of possible sinusitis and bronchitis. He has been having a runny nose and congestion for the past few days. his heart rate was in the 150???s and he was sent to the ER.  He denied any significant SOB. He has had a mild cough since his cold.   The last time he saw a physician was over 2 to 3 years ago and at that time he was in a normal rhythm and does not recall having any palpitations recently. He denies any chest pain. He denies any shortness of breath. He denies any nausea, vomiting, dizziness, or any other associated symptoms presently.   The patient received one dose of bolus IV Cardizem in the ER without much improvement in his rate and was placed on a Cardizem drip. Digoxin 0.5 IV x 1 was given with improved rate.    Present Illness . PAST MEDICAL HISTORY: He has no significant past medical history.   ALLERGIES: No known drug allergies.   SOCIAL HISTORY: No smoking. No alcohol abuse. No illicit drug abuse. Lives at home with his wife. He is employed. Works in the [employer] at [employer].   FAMILY HISTORY: Mother is alive, has history of cancer; he cannot recall from what. Father died from complications of old age.   Physical Exam:   GEN WD, WN, NAD    HEENT red conjunctivae    NECK supple    RESP normal resp effort  clear BS  rhonchi  right base    CARD Irregular rate and rhythm  No murmur    ABD denies tenderness  soft    LYMPH negative neck    EXTR negative edema    SKIN normal to palpation    NEURO motor/sensory function intact    PSYCH alert, A+O to time, place, person, good insight   Review of Systems:   Subjective/Chief Complaint cough, sinus congestion    General: No Complaints    Skin: No Complaints    ENT: No  Complaints    Eyes: No Complaints    Neck: No Complaints    Respiratory: mild cough    Cardiovascular: No Complaints    Gastrointestinal: No Complaints    Genitourinary: No Complaints    Vascular: No Complaints    Musculoskeletal: No Complaints    Neurologic: No Complaints    Hematologic: No Complaints    Endocrine: No Complaints    Psychiatric: No Complaints    Review of Systems: All other systems were reviewed and found to be negative    Medications/Allergies Reviewed Medications/Allergies reviewed     Denies medical history:        Admit Reason:   Atrial fibrillation: (427.31) Active, ICD9, Atrial fibrillation    Routine Chem:  21-Mar-13 19:06    B-Type Natriuretic Peptide [facility]) 3288  Cardiac:  21-Mar-13 19:06    CK, Total 203   CPK-MB, Serum 3.9  Routine Chem:  21-Mar-13 19:06    Glucose, Serum 97   BUN 22   Creatinine (comp) 1.34   Sodium, Serum 142   Potassium, Serum 4.6   Chloride, Serum 107   CO2, Serum 25   Calcium (Total), Serum 8.6  Hepatic:  21-Mar-13 19:06    Bilirubin, Total 0.7   Alkaline Phosphatase 66   SGPT (ALT) 30   SGOT (  AST) 35   Total Protein, Serum 6.6   Albumin, Serum 3.4  Routine Chem:  21-Mar-13 19:06    Osmolality (calc) 286   eGFR (African American) >60   eGFR (Non-African American) 58   Anion Gap 10  Routine Hem:  21-Mar-13 19:06    WBC (CBC) 6.4   RBC (CBC) 4.52   Hemoglobin (CBC) 13.9   Hematocrit (CBC) 40.9   Platelet Count (CBC) 234   MCV 90   MCH 30.6   MCHC 33.9   RDW 12.6  Routine Chem:  21-Mar-13 19:06    Magnesium, Serum 2.0  Thyroid:  21-Mar-13 19:06    Thyroid Stimulating Hormone 1.94  Cardiac:  21-Mar-13 19:06    Troponin I 0.05  Blood Glucose:  21-Mar-13 22:38    POCT Blood Glucose 85   EKG:   Interpretation EKG shows atriaL Fib with rate of 158 bpm, nonspecific ST and T wave ABN in V4 to V6   Radiology Results: XRay:    21-Mar-13 19:10, Chest PA and Lateral   Chest PA and  Lateral    REASON FOR EXAM:    tachy  COMMENTS:   May transport without cardiac monitor    PROCEDURE: DXR - DXR CHEST PA (OR AP) AND LATERAL  - Jun 04 2011  7:10PM     RESULT: There is no previous exam for comparison.    There is increased density at the right lung base and, to a lesser extent   the left lung base. No effusion, edema or pneumothorax is seen. The   cardiac silhouette is borderline enlarged.    IMPRESSION:   1. Right lung base pneumonia. Atelectasis is felt to be less likely.   Minimal left lung base atelectasis present.      Verified By: Sundra Aland, M.D., MD    No Known Allergies:   Vital Signs/Nurse's Notes: **Vital Signs.:   22-Mar-13 08:00   Temperature Temperature (F) 98.5   Celsius 36.9   Pulse Pulse 86   Respirations Respirations 29   Systolic BP Systolic BP 553   Diastolic BP (mmHg) Diastolic BP (mmHg) 66   Mean BP 79   Pulse Ox % Pulse Ox % 95   Pulse Ox Activity Level  At rest   Oxygen Delivery Room Air/ 21 %   Pulse Ox Heart Rate 39     Impression 58 year old male with no significant cardiac hx, presenting with atrial fibrillation with RVR.   A/P: 1) Atrial fib: Rate improved on diltiazem 30 mg Q6 and  digoxin BP low in the ER and this AM. COnsider decreasing dose of diltiazem to 30 mg q 8, add digoxin 0.25 mg daily. He can monitor BP as an outpt and if BP low, could decrease diltriazem to BID. --Continue pradaxa 150 mg BID (I will leave samples in my office for him to pick up) Echo today. --If he does not convert over the next few weeks, will need cardioversion.   2) Sinus congestion: He may need ABX for symptoms. Will defer to hospitalist if needed   Electronic Signatures: Ida Rogue (MD)  (Signed 22-Mar-13 09:35)  Authored: General Aspect/Present Illness, History and Physical Exam, Review of System, Past Medical History, Health Issues, Home Medications, Labs, EKG , Radiology, Allergies, Vital Signs/Nurse's Notes,  Impression/Plan   Last Updated: 22-Mar-13 09:35 by Ida Rogue (MD)

## 2014-07-08 NOTE — H&P (Signed)
PATIENT NAME:  [name], [name] MR#:  [id number] DATE OF BIRTH:  [birth year]  DATE OF ADMISSION:  06/04/2011  PRIMARY CARE PHYSICIAN: He does not have one.   CHIEF COMPLAINT: Tachycardia and new onset atrial fibrillation.   HISTORY OF PRESENT ILLNESS: This is a 58 year old male who actually went to the [facility] clinic for treatment of possible sinusitis and bronchitis. He has been having a runny nose and congestion for the past few days. He went in to see if he could get something for his congestion and possibly a Z-Pak. When they did his vitals they noted that his heart rate was in the 150's. He came to the ER for further evaluation and was noted to be in new onset atrial fibrillation. The patient does not have a primary care physician and, therefore, does not see doctors regularly. The last time he saw a physician was over 2 to 3 years ago and at that time he was in a normal rhythm and does not recall ever having any palpitations recently. He denies any chest pain. He denies any shortness of breath. He denies any nausea, vomiting, dizziness, or any other associated symptoms presently.   The patient received one dose of bolus IV Cardizem without much improvement in his symptoms and, therefore, was placed on a Cardizem drip.   REVIEW OF SYSTEMS: CONSTITUTIONAL: No documented fever. No weight gain, no weight loss. EYES: No blurry or double vision. ENT: No tinnitus, no postnasal drip, no redness of the oropharynx. RESPIRATORY: No cough, no wheeze, no hemoptysis. CARDIOVASCULAR: No chest pain, no orthopnea, no palpitations, no syncope. GI: No nausea, no vomiting, no diarrhea, no abdominal pain, no melena, no hematochezia. GU: No dysuria, no hematuria. ENDOCRINE: No polyuria, no nocturia. No heat or cold intolerance. HEME: No anemia, no bruising, no bleeding. INTEGUMENTARY: No rashes, no lesions. MUSCULOSKELETAL: No arthritis, no swelling, no gout. NEUROLOGIC: No numbness, no tingling, no ataxia,  no seizure-type activity. PSYCH: No anxiety, no insomnia, no ADD.   PAST MEDICAL HISTORY: He has no significant past medical history.   ALLERGIES: No known drug allergies.   SOCIAL HISTORY: No smoking. No alcohol abuse. No illicit drug abuse. Lives at home with his wife. He is employed. Works in the [employer] at [employer].   FAMILY HISTORY: Mother is alive, has history of cancer; he cannot recall from what. Father died from complications of old age.   CURRENT MEDICATIONS: He is currently on no medications.   PHYSICAL EXAMINATION ON ADMISSION.   VITAL SIGNS: Temperature 97.7, pulse 100, respirations 18, blood pressure 95/76, sats 98% on room air.   GENERAL: He is a pleasant appearing male in no apparent distress.   HEENT: Atraumatic, normocephalic. His extraocular muscles are intact. His pupils are equal and reactive to light. Sclerae anicteric. No conjunctival injection. No pharyngeal erythema.   NECK: Supple. No jugular venous distention, no bruits, no lymphadenopathy, no thyromegaly.   HEART: Irregular, tachycardic. No murmurs, no rubs, no clicks.   LUNGS: Clear to auscultation bilaterally. No rales, no rhonchi, no wheezes.   ABDOMEN: Soft, flat, nontender, nondistended. Has good bowel sounds. No hepatosplenomegaly appreciated.   EXTREMITIES: No evidence of any cyanosis, clubbing, or peripheral edema. Has +2 pedal and radial pulses bilaterally.   NEUROLOGIC: The patient is alert, awake, and oriented x3 with no focal motor or sensory deficits appreciated bilaterally.   SKIN: Moist and warm with no rash appreciated.   LYMPHATIC: There is no cervical or axillary lymphadenopathy.  LABORATORY, DIAGNOSTIC, AND RADIOLOGICAL DATA: Serum glucose 97, BUN 22, creatinine 1.3, sodium 142, potassium 4.6, chloride 107, bicarb 25. The patient's LFTs are within normal limits. BNP 3288. White cell count 6.4, hemoglobin 13.9, hematocrit 40.9, platelet count 234. TSH 1.9.  Troponin 0.05.    The patient did have a chest x-ray done and his chest x-ray shows a suspected right lung base pneumonia.   ASSESSMENT AND PLAN: This is a 59 year old male with no significant past medical history who went [facility] for possible bronchitis and sinusitis and was noted to be tachycardic and noted to be in new onset atrial fibrillation.  1. New onset atrial fibrillation. The patient's rate is currently uncontrolled. He was given one dose of IV Cardizem which did not alleviate his symptoms, therefore, he was started on Cardizem drip. His TSH is normal. He is on the hypotensive side, therefore, I will give him IV load of digoxin. Will also start him on some p.o. rate control with Cardizem. Will get a two-dimensional echocardiogram. Consult Cardiology. I discussed the case with Dr. Mariah MillingGollan. Will go ahead and start him on Pradaxa for now.  2. Congestive heart failure. The patient's chest x-ray is suggestive of right lung base pneumonia but I think this is more interstitial edema from him being in atrial fibrillation. His BNP is slightly elevated although he is not hypoxic. I will hold off on diuresis and also any antibiotics at this time. Will get a two-dimensional echo and follow him clinically for now.   CODE STATUS: The patient is a FULL CODE.   TIME SPENT WITH THE ADMISSION: 45 minutes.   ____________________________ Rolly PancakeVivek J. Cherlynn KaiserSainani, MD vjs:[id number] D: 06/04/2011 21:54:45 ET T: 06/05/2011 06:49:08 ET JOB#: [id number]  cc: Rolly PancakeVivek J. Cherlynn KaiserSainani, MD, <Dictator> Houston SirenVIVEK J Pattijo Juste MD ELECTRONICALLY SIGNED 06/05/2011 18:45

## 2014-07-08 NOTE — Discharge Summary (Signed)
PATIENT NAME:  [name], Chima L MR#:  [id number] DATE OF BIRTH:  17-Jul-1955  DATE OF ADMISSION:  06/04/2011 DATE OF DISCHARGE:  06/05/2011  ADMITTING PHYSICIAN: Hilda LiasVivek Sainani, MD  DISCHARGING PHYSICIAN: Enid Baasadhika Saman Giddens, MD  PRIMARY CARE PHYSICIAN: None.  CONSULTANTS: Julien Nordmannimothy Gollan, MD - Cardiology.  DISCHARGE DIAGNOSES:  1. New onset atrial fibrillation with rapid ventricular response. 2. Right lower lobe pneumonia. 3. Borderline elevated troponin secondary to tachycardia.  DISCHARGE MEDICATIONS: 1. Pradaxa 150 mg p.o. twice a day. 2. Cardizem 30 mg p.o. three times daily.  3. Digoxin 0.25 mg p.o. daily. 4. Levaquin 750 mg p.o. daily for 7 days.  DISCHARGE DIET: Low sodium diet.  DISCHARGE ACTIVITY: As tolerated.  FOLLOWUP INSTRUCTIONS: Followup with Dr. Mariah MillingGollan in 2 weeks.  LABS AND IMAGING STUDIES: WBC 6.4, hemoglobin 13.9, hematocrit 40.9, platelet count 234.   Sodium 142, potassium 4.6, chloride 107, bicarbonate 25, BUN 22, creatinine 134, glucose 97, calcium 8.6.   CK 203, CK-MB 3.9. BNP was elevated at 3288. Troponin first set was 0.06 and second set improved to 0.04.   Chest x-ray on admission: Right lung base pneumonia, atelectasis felt to be less likely, and minimal left lung base atelectasis also present.   ALT 30, AST 35, alkaline phosphatase 66, total bilirubin 0.7, albumin 3.4. Magnesium 3.0. TSH 1.94.   BRIEF HOSPITAL COURSE: [name] is a 59 year old male with no significant past medical history who has been having symptoms of congestion, runny nose, and bronchitis symptoms for the past week. He went to a walk-in clinic and was found to have heart rate in the 150s to 160s. EKG done there showed atrial fibrillation.  The patient was sent to the emergency room. He has not had a primary care physician over the last 2 to 3 years. 1. New onset atrial fibrillation with rapid ventricular response: The patient required IV Cardizem pushes several times to control his  rate and actually a loading dose of IV digoxin has worked to bring his rate down to 70s and 80s. He was placed on Cardizem drip and put in the CCU.  His rate was well controlled, but his rhythm was maintained in atrial fibrillation. He was converted over to oral Cardizem. The dose is being decreased to 30 mg every 12 to 8 hours based on his heart rate. This is because his blood pressure has been low normal.  He ambulated well without any chest pain or further increase in heart rate. He is being discharged on oral Cardizem and oral digoxin. He is placed on Pradaxa for anticoagulation and will followup with Dr. Mariah MillingGollan in 2 weeks to plan for cardioversion as an outpatient if he still does not convert by that time.  2. Right lower lobe infiltrate seen on chest x-ray with symptoms of bronchitis, congestion: We will start on Levaquin, although he does not have any fever or white count. His course has been otherwise uneventful in the hospital.  DISCHARGE CONDITION: Stable.            DISCHARGE DISPOSITION: Home.  TIME SPENT ON DISCHARGE: 40 minutes.  ____________________________ Enid Baasadhika Bensyn Bornemann, MD rk:slb D: 06/05/2011 17:15:53 ET T: 06/05/2011 17:26:04 ET JOB#: [id number]  cc: Enid Baasadhika Addyson Traub, MD, <Dictator> Antonieta Ibaimothy J. Gollan, MD Enid BaasADHIKA Taylie Helder MD ELECTRONICALLY SIGNED 06/07/2011 0:08

## 2014-07-11 ENCOUNTER — Other Ambulatory Visit: Payer: Self-pay | Admitting: Cardiovascular Disease

## 2014-07-15 ENCOUNTER — Other Ambulatory Visit: Payer: Self-pay | Admitting: Cardiovascular Disease

## 2014-08-27 ENCOUNTER — Ambulatory Visit (INDEPENDENT_AMBULATORY_CARE_PROVIDER_SITE_OTHER): Payer: BC Managed Care – PPO | Admitting: Cardiovascular Disease

## 2014-08-27 ENCOUNTER — Encounter: Payer: Self-pay | Admitting: Cardiovascular Disease

## 2014-08-27 VITALS — BP 120/76 | HR 42 | Ht 70.5 in | Wt 190.0 lb

## 2014-08-27 DIAGNOSIS — I4891 Unspecified atrial fibrillation: Secondary | ICD-10-CM

## 2014-08-27 MED ORDER — AMIODARONE HCL 100 MG PO TABS: 100.0000 mg | ORAL_TABLET | Freq: Every day | ORAL | Status: DC

## 2014-08-27 MED ORDER — CARVEDILOL 3.125 MG PO TABS: 3.1250 mg | ORAL_TABLET | Freq: Two times a day (BID) | ORAL | Status: DC

## 2014-08-27 MED ORDER — APIXABAN 5 MG PO TABS: 5.0000 mg | ORAL_TABLET | Freq: Two times a day (BID) | ORAL | Status: DC

## 2014-08-27 NOTE — Assessment & Plan Note (Signed)
Paroxysmal atrial fibrillation. Recommended he increase the eliquis back to 5 g twice a day We will check amiodarone surveillance labs on today's visit Continue low-dose amiodarone and Coreg We did discuss possibly changing to flecainide. No medication changes at this time He does have asymptomatic bradycardia

## 2014-08-27 NOTE — Progress Notes (Signed)
Patient ID: [name], male    DOB: Nov 27, 1955, 59 y.o.   MRN: [id number]  HPI Comments: [name] is a very pleasant 59 year old gentleman with  history of paroxysmal atrial fibrillation who presented to [facility] 06/05/2011 after being seen at urgent care for sinusitis found to have atrial fibrillation with RVR. He was started on Cardizem, digoxin, pradaxa.  started on amiodarone in mid May 2013. 2 weeks later, EKG showed sinus rhythm. Secondary to bradycardia, medications were changed and he was started on low-dose carvedilol 6.25 mg twice a day which was subsequently decreased to 3.125 mg twice daily Prior Echocardiogram in 2013 showed  ejection fraction 35-45% with moderate global hypokinesis, Mildly dilated left atrium, and normal rvsp. repeat echocardiogram after restoring sinus rhythm confirmed ejection fraction 50-55%, mild to moderate TR, mild AI.  He presents for routine follow-up of his atrial fibrillation  In general he feels well. 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 in the morning, heart rate was higher, felt he was in atrial fibrillation using his phone heart rate monitor Took his medications later in the day with improvement of his rhythm Other recordings of his pulse do show a regular rhythm, possible paroxysmal atrial fibrillation, sometimes with low heart rate in the 40s He is only been taking amiodarone 100 mg daily, Coreg 3.125 mill grams daily, and for some reason only taking eliquis 5 mg daily  EKG on today's visit shows normal sinus rhythm with rate 42 bpm, T-wave abnormality diffusely  Other past medical history  in February 2014  was noted to be in normal sinus rhythm.   seen by Dr. Kirke Corin  and started on Multaq for recurrent atrial fibrillation. He converted to normal sinus rhythm  At the end of 2014, he was back in atrial fibrillation. He was having shortness of breath at rest and with exertion. He was taking carvedilol 3.125 mg twice a  day He was started on eliquis 5 mg twice a day,  After one month we started high-dose amiodarone in an effort to cardiovert. This was successful on May 03 2013 he converted to normal sinus rhythm her heart rate was in the high 30s.  Informed call followup, we suggested he decrease amiodarone down to 200 mg daily, Coreg 3.125 mg twice a day. He has been 10 days since then and his heart rate today continues to be in the high 30s. 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   No current facility-administered medications on file prior to visit.    Past Medical History  Diagnosis Date  . Atrial fibrillation     Past Surgical History  Procedure Laterality Date  . Tendon exploration    . Biceps tendon repair      Social History  reports that he has never smoked. He does not have any smokeless tobacco history on file. He reports that he drinks about 0.5 oz of alcohol per week. He reports that he does not use illicit drugs.  Family History family history includes Dementia in his father; Hypertension in his father.   Review of Systems  Constitutional: Negative.   Eyes: Negative.   Respiratory: Negative.   Cardiovascular: Negative.   Gastrointestinal: Negative.   Musculoskeletal: Negative.  Skin: Negative.   Neurological: Negative.   Hematological: Negative.   Psychiatric/Behavioral: Negative.   All other systems reviewed and are negative.   BP 120/76 mmHg  Pulse 42  Ht 5' 10.5" (1.791 m)  Wt 190 lb (86.183 kg)  BMI 26.87 kg/m2  Physical Exam  Constitutional: He is oriented to person, place, and time. He appears well-developed and well-nourished.  HENT:  Head: Normocephalic.  Nose: Nose normal.  Mouth/Throat: Oropharynx is clear and moist.  Eyes:  Conjunctivae are normal. Pupils are equal, round, and reactive to light.  Neck: Normal range of motion. Neck supple. No JVD present.  Cardiovascular: S1 normal, S2 normal, normal heart sounds and intact distal pulses.  An irregularly irregular rhythm present. Bradycardia present.  Exam reveals no gallop and no friction rub.   No murmur heard. Pulmonary/Chest: Effort normal and breath sounds normal. No respiratory distress. He has no wheezes. He has no rales. He exhibits no tenderness.  Abdominal: Soft. Bowel sounds are normal. He exhibits no distension. There is no tenderness.  Musculoskeletal: Normal range of motion. He exhibits no edema or tenderness.  Lymphadenopathy:    He has no cervical adenopathy.  Neurological: He is alert and oriented to person, place, and time. Coordination normal.  Skin: Skin is warm and dry. No rash noted. No erythema.  Psychiatric: He has a normal mood and affect. His behavior is normal. Judgment and thought content normal.      Assessment and Plan   Nursing note and vitals reviewed.      will

## 2014-08-27 NOTE — Patient Instructions (Signed)
You are doing well. No medication changes were made.  We will check labs today  Please call us if you have new issues that need to be addressed before your next appt.  Your physician wants you to follow-up in: 12 months.  You will receive a reminder letter in the mail two months in advance. If you don't receive a letter, please call our office to schedule the follow-up appointment.   

## 2014-08-28 LAB — HEPATIC FUNCTION PANEL
ALK PHOS: 73 IU/L (ref 39–117)
ALT: 20 IU/L (ref 0–44)
AST: 22 IU/L (ref 0–40)
Albumin: 4.3 g/dL (ref 3.6–4.8)
BILIRUBIN, DIRECT: 0.16 mg/dL (ref 0.00–0.40)
Bilirubin Total: 0.8 mg/dL (ref 0.0–1.2)
Total Protein: 6.1 g/dL (ref 6.0–8.5)

## 2014-08-28 LAB — TSH: TSH: 5.03 u[IU]/mL — ABNORMAL HIGH (ref 0.450–4.500)

## 2014-09-13 ENCOUNTER — Other Ambulatory Visit: Payer: Self-pay

## 2014-09-13 MED ORDER — CARVEDILOL 3.125 MG PO TABS: 3.1250 mg | ORAL_TABLET | Freq: Two times a day (BID) | ORAL | Status: DC

## 2014-09-13 NOTE — Telephone Encounter (Signed)
Refill sent for carvedilol.  

## 2015-09-26 ENCOUNTER — Other Ambulatory Visit: Payer: Self-pay

## 2015-09-26 MED ORDER — AMIODARONE HCL 100 MG PO TABS: 100.0000 mg | ORAL_TABLET | Freq: Every day | ORAL | Status: DC

## 2015-11-04 ENCOUNTER — Other Ambulatory Visit: Payer: Self-pay | Admitting: Cardiovascular Disease

## 2015-11-04 ENCOUNTER — Other Ambulatory Visit: Payer: Self-pay

## 2015-11-04 MED ORDER — APIXABAN 5 MG PO TABS: 5.0000 mg | ORAL_TABLET | Freq: Two times a day (BID) | ORAL | 0 refills | Status: DC

## 2015-11-26 NOTE — Progress Notes (Deleted)
Cardiology Office Note Date:  11/26/2015  Patient ID:  [name], DOB 01/21/1956, MRN [id number] PCP:  Barron AlvineGIBSON,DAVID, MD  Cardiologist:  Dr. Mariah MillingGollan, MD  ***refresh   Chief Complaint: Follow up Afib  History of Present Illness: [name] is a 60 y.o. male with history of PAF initially found on 06/05/2011 in the setting of sinusitis who presents for evaluation of his Afib. He was initially started on Cardizem, digoxin, and Pradaxa. This was followed by starting amiodarone in 07/2011 leading to pharmaceutical conversion to NSR in follow up EKG study. Secondary to bradycardia he was chaged to Coreg 6.25 mg bid subsequently being further decreased to 3.125 mg bid. His amiodarone had been decreased to 100 mg daily. Echo from 2013 showed an EF of 35-45%, moderate global HK, midlly dilated LA, and normal RVSP. Repeat echo s/p conversion to NSR showed improved in EF to 50-55%, mild to moderate TR, mild AI. He was seen by Dr. Kirke CorinArida in 04/2012, noted to be in NSR. In follow up with Dr. Kirke CorinArida in 07/2012 he was noted to be back in Afib. He was started on Multaq at that time in place of amiodarone given his age. In follow up 09/2012 he remained in NSR. His anticoagulation was discontinued at that time by Dr. Mariah MillingGollan. At the end of 2014 he was noted to be back in Afib, started on Eliquis, increased Coreg dose, and was continued on Multaq. In follow up in 04/2013 he remained in Afib. He was restarted on amiodarone by TG, leading to successful pharmaceutical cardioversion in 04/2013. He has cotinued to have issues with sinus bradycardia. He was most recently seen in 08/2014 and was feeling well at that time. It was noted he has previously only been taking Eliquis once daily rather than bid per Dr. Windell HummingbirdGollan's note.    Past Medical History:  Diagnosis Date  . Atrial fibrillation     Past Surgical History:  Procedure Laterality Date  . BICEPS TENDON REPAIR    . TENDON EXPLORATION      Current Outpatient  Prescriptions  Medication Sig Dispense Refill  . amiodarone (PACERONE) 100 MG tablet Take 1 tablet (100 mg total) by mouth daily. 90 tablet 3  . apixaban (ELIQUIS) 5 MG TABS tablet Take 1 tablet (5 mg total) by mouth 2 (two) times daily. 30 tablet 0  . carvedilol (COREG) 3.125 MG tablet Take 1 tablet (3.125 mg total) by mouth 2 (two) times daily. 180 tablet 3   No current facility-administered medications for this visit.     Allergies:   Review of patient's allergies indicates no known allergies.   Social History:  The patient  reports that he has never smoked. He does not have any smokeless tobacco history on file. He reports that he drinks about 0.5 oz of alcohol per week . He reports that he does not use drugs.   Family History:  The patient's family history includes Dementia in his father; Hypertension in his father.  ROS:   ROS   PHYSICAL EXAM: *** VS:  There were no vitals taken for this visit. BMI: There is no height or weight on file to calculate BMI.  Physical Exam   EKG:  Was ordered and interpreted by me today. Shows ***  Recent Labs: No results found for requested labs within last 8760 hours.  No results found for requested labs within last 8760 hours.   CrCl cannot be calculated (Unknown ideal weight.).   Wt Readings from Last 3  Encounters:  08/27/14 190 lb (86.2 kg)  05/12/13 191 lb 8 oz (86.9 kg)  05/12/13 191 lb 8 oz (86.9 kg)     Other studies reviewed: Additional studies/records reviewed today include: summarized above  ASSESSMENT AND PLAN:  1. ***  Disposition: F/u with *** in   Current medicines are reviewed at length with the patient today.  The patient did not have any concerns regarding medicines.  Rick Dodge PA-C 11/26/2015 5:13 PM     [facility] [address] [place], [place] [address] [phone]

## 2015-11-27 ENCOUNTER — Other Ambulatory Visit: Payer: Self-pay

## 2015-11-27 ENCOUNTER — Ambulatory Visit: Payer: BC Managed Care – PPO | Admitting: Physician Assistant

## 2015-11-27 ENCOUNTER — Other Ambulatory Visit: Payer: Self-pay | Admitting: *Deleted

## 2015-11-27 MED ORDER — CARVEDILOL 3.125 MG PO TABS: 3.1250 mg | ORAL_TABLET | Freq: Two times a day (BID) | ORAL | 1 refills | Status: DC

## 2015-11-27 MED ORDER — AMIODARONE HCL 100 MG PO TABS: 100.0000 mg | ORAL_TABLET | Freq: Every day | ORAL | 1 refills | Status: DC

## 2015-11-27 MED ORDER — APIXABAN 5 MG PO TABS: 5.0000 mg | ORAL_TABLET | Freq: Two times a day (BID) | ORAL | 1 refills | Status: DC

## 2015-11-28 ENCOUNTER — Ambulatory Visit: Payer: BC Managed Care – PPO | Admitting: Physician Assistant

## 2016-01-20 ENCOUNTER — Ambulatory Visit (INDEPENDENT_AMBULATORY_CARE_PROVIDER_SITE_OTHER): Payer: BC Managed Care – PPO | Admitting: Cardiovascular Disease

## 2016-01-20 ENCOUNTER — Encounter: Payer: Self-pay | Admitting: Cardiovascular Disease

## 2016-01-20 VITALS — BP 120/78 | HR 47 | Ht 70.0 in | Wt 189.0 lb

## 2016-01-20 DIAGNOSIS — I4891 Unspecified atrial fibrillation: Secondary | ICD-10-CM

## 2016-01-20 DIAGNOSIS — R001 Bradycardia, unspecified: Secondary | ICD-10-CM | POA: Diagnosis not present

## 2016-01-20 DIAGNOSIS — Z0000 Encounter for general adult medical examination without abnormal findings: Secondary | ICD-10-CM

## 2016-01-20 NOTE — Progress Notes (Signed)
Cardiology Office Note  Date:  01/20/2016   ID:  [name], DOB 07/06/1955, MRN [id number]  PCP:  Barron AlvineGIBSON,DAVID, MD   Chief Complaint  Patient presents with  . 12 month checkup    HPI:  [name] is a very pleasant 60 year old gentleman with  history of paroxysmal atrial fibrillation who presented to [facility] 06/05/2011 after being seen at urgent care for sinusitis found to have atrial fibrillation with RVR. He was started on Cardizem, digoxin, pradaxa.  started on amiodarone in mid May 2013. 2 weeks later, EKG showed sinus rhythm. Secondary to bradycardia, medications were changed and he was started on low-dose carvedilol 6.25 mg twice a day which was subsequently decreased to 3.125 mg twice daily Prior Echocardiogram in 2013 showed  ejection fraction 35-45% with moderate global hypokinesis, Mildly dilated left atrium, and normal rvsp. repeat echocardiogram after restoring sinus rhythm confirmed ejection fraction 50-55%, mild to moderate TR, mild AI.  He presents for routine follow-up of his atrial fibrillation  In follow-up today, he reports that he Feels well, no complaints, Rare sx of palpitations, does not last more than 15 minutes Has not seen primary care, no recent lab work No chest pain or shortness of breath on exertion Denies having symptoms from his bradycardia  Previous lab work reviewed TSH 5 in 2016: Normal LFTs in 2016  EKG on today's visit shows normal sinus rhythm with rate 49 bpm, T-wave abnormality diffusely  Other past medical history  in February 2014  was noted to be in normal sinus rhythm.   seen by Dr. Kirke CorinArida  and started on Multaq for recurrent atrial fibrillation. He converted to normal sinus rhythm  At the end of 2014, he was back in atrial fibrillation. He was having shortness of breath at rest and with exertion. He was taking carvedilol 3.125 mg twice a day He was started on eliquis 5 mg twice a day,  After one month we started high-dose  amiodarone in an effort to cardiovert. This was successful on May 03 2013 he converted to normal sinus rhythm her heart rate was in the high 30s.  Informed call followup, we suggested he decrease amiodarone down to 200 mg daily, Coreg 3.125 mg twice a day. He has been 10 days since then and his heart rate today continues to be in the high 30s. 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   No current facility-administered medications on file prior to visit.      PMH:   has a past medical history of Atrial fibrillation (HCC).  PSH:    Past Surgical History:  Procedure Laterality Date  . BICEPS TENDON REPAIR    . TENDON EXPLORATION      Current Outpatient Prescriptions  Medication Sig Dispense Refill  . amiodarone (PACERONE) 100 MG tablet Take 1 tablet (100 mg total) by mouth daily. 30 tablet 1  . apixaban (ELIQUIS) 5 MG TABS tablet Take 1 tablet (5 mg total) by mouth 2 (two) times daily. 60 tablet 1  . carvedilol (COREG) 3.125 MG tablet Take 1 tablet (3.125 mg total) by mouth 2 (two) times daily. 60 tablet 1   No current facility-administered medications for this visit.      Allergies:   Patient has no known allergies.  Social History:  The patient  reports that he has never smoked. He has never used smokeless tobacco. He reports that he drinks about 0.5 oz of alcohol per week . He reports that he does not use drugs.   Family History:   family history includes Dementia in his father; Hypertension in his father.    Review of Systems: Review of Systems  Constitutional: Negative.   Respiratory: Negative.   Cardiovascular: Negative.   Gastrointestinal: Negative.   Musculoskeletal: Negative.   Neurological: Negative.   Psychiatric/Behavioral:  Negative.   All other systems reviewed and are negative.    PHYSICAL EXAM: VS:  BP 120/78   Pulse (!) 47   Ht 5\' 10"  (1.778 m)   Wt 189 lb (85.7 kg)   SpO2 93%   BMI 27.12 kg/m  , BMI Body mass index is 27.12 kg/m. GEN: Well nourished, well developed, in no acute distress HEENT: normal Neck: no JVD, carotid bruits, or masses Cardiac: RRR; no murmurs, rubs, or gallops,no edema  Respiratory:  clear to auscultation bilaterally, normal work of breathing GI: soft, nontender, nondistended, + BS MS: no deformity or atrophy Skin: warm and dry, no rash Neuro:  Strength and sensation are intact Psych: euthymic mood, full affect    Recent Labs: No results found for requested labs within last 8760 hours.    Lipid Panel No results found for: CHOL, HDL, LDLCALC, TRIG    Wt Readings from Last 3 Encounters:  01/20/16 189 lb (85.7 kg)  08/27/14 190 lb (86.2 kg)  05/12/13 191 lb 8 oz (86.9 kg)       ASSESSMENT AND PLAN:  Routine check-up - Plan: EKG 12-Lead, TSH, Hepatic function panel  Atrial fibrillation, unspecified type (HCC) Very rare episodes of atrial fibrillation, he is tolerating anticoagulation No indication to change his medications Amiodarone surveillance labs drawn today  Bradycardia Asymptomatic bradycardia Rate slowly improving over the past several years No further workup needed  Disposition:   F/U  12 months   Orders Placed This Encounter  Procedures  . TSH  . Hepatic function panel  . EKG 12-Lead     Signed, Dossie Arbourim Kamdon Reisig, M.D., Ph.D. 01/20/2016  [facility] [place] [phone]

## 2016-01-20 NOTE — Patient Instructions (Addendum)
Please call if you have increased frequency or duration of atrial fib  Load the APP on the phone    Medication Instructions:   If needed, we could change to xarelto 20 mg once a day  Labwork:  No new labs needed  Testing/Procedures:  No further testing at this time   Follow-Up: It was a pleasure seeing you in the office today. Please call us if you have new issues that need to be addressed before your next appt.  [phone]  Your physician wants you to follow-up in: 12 months.  You will receive a reminder letter in the mail two months in advance. If you don't receive a letter, please call our office to schedule the follow-up appointment.  If you need a refill on your cardiac medications before your next appointment, please call your pharmacy.

## 2016-01-21 LAB — HEPATIC FUNCTION PANEL
ALBUMIN: 4.6 g/dL (ref 3.6–4.8)
ALK PHOS: 72 IU/L (ref 39–117)
ALT: 24 IU/L (ref 0–44)
AST: 27 IU/L (ref 0–40)
BILIRUBIN, DIRECT: 0.18 mg/dL (ref 0.00–0.40)
Bilirubin Total: 1 mg/dL (ref 0.0–1.2)
Total Protein: 6.8 g/dL (ref 6.0–8.5)

## 2016-01-21 LAB — TSH: TSH: 6.65 u[IU]/mL — ABNORMAL HIGH (ref 0.450–4.500)

## 2016-01-22 ENCOUNTER — Other Ambulatory Visit: Payer: Self-pay | Admitting: Cardiovascular Disease

## 2016-01-28 ENCOUNTER — Telehealth: Payer: Self-pay | Admitting: Cardiovascular Disease

## 2016-01-28 MED ORDER — CARVEDILOL 3.125 MG PO TABS: 3.1250 mg | ORAL_TABLET | Freq: Two times a day (BID) | ORAL | 3 refills | Status: AC

## 2016-01-28 MED ORDER — AMIODARONE HCL 100 MG PO TABS: 100.0000 mg | ORAL_TABLET | Freq: Every day | ORAL | 3 refills | Status: DC

## 2016-01-28 MED ORDER — APIXABAN 5 MG PO TABS: 5.0000 mg | ORAL_TABLET | Freq: Two times a day (BID) | ORAL | 3 refills | Status: AC

## 2016-01-28 NOTE — Telephone Encounter (Signed)
°*  STAT* If patient is at the pharmacy, call can be transferred to refill team.   1. Which medications need to be refilled? (please list name of each medication and dose if known)  Amiodarone  eliquis Carvedilol   2. Which pharmacy/location (including street and city if local pharmacy) is medication to be sent to? [facility] [place]   3. Do they need a 30 day or 90 day supply? 90 day

## 2016-01-28 NOTE — Telephone Encounter (Signed)
Spoke w/ pt.  He takes amiodarone 100 mg once daily. States that he discussed this w/ Dr. Mariah MillingGollan at last ov and he was agreeable to this dose.   He requests 90 day supplies on his cardiac meds.

## 2016-01-28 NOTE — Telephone Encounter (Signed)
Left message for pt to call back  °

## 2016-01-28 NOTE — Telephone Encounter (Signed)
Please review chart, patient is requesting refills on the following: Amiodarone Eliquis Carvedilol  [facility], patient was instructed to decrease to 200 of amiodarone but states patient is on 100 mg.  Needs clarification on the above medications before refilling.

## 2016-03-21 ENCOUNTER — Other Ambulatory Visit: Payer: Self-pay | Admitting: Cardiovascular Disease

## 2017-01-20 ENCOUNTER — Other Ambulatory Visit: Payer: Self-pay | Admitting: *Deleted

## 2017-02-08 ENCOUNTER — Other Ambulatory Visit: Payer: Self-pay | Admitting: *Deleted

## 2017-02-09 MED ORDER — AMIODARONE HCL 100 MG PO TABS: 100.0000 mg | ORAL_TABLET | Freq: Every day | ORAL | 0 refills | Status: AC

## 2018-07-07 ENCOUNTER — Telehealth: Payer: Self-pay

## 2018-07-07 NOTE — Telephone Encounter (Signed)
Called patient from [facility].  No answer. LMOV.  Patient is past due for a follow up.

## 2018-07-07 NOTE — Telephone Encounter (Signed)
Patient is following up with cardiology elsewhere No longer needs follow up
